# Patient Record
Sex: Male | Born: 1935 | Race: White | Hispanic: No | Marital: Married | Smoking: Never smoker
Health system: Southern US, Community
[De-identification: ages and names within clinical notes are randomized; demographics above are authoritative.]

## PROBLEM LIST (undated history)

## (undated) DIAGNOSIS — T4145XA Adverse effect of unspecified anesthetic, initial encounter: Secondary | ICD-10-CM

## (undated) DIAGNOSIS — K219 Gastro-esophageal reflux disease without esophagitis: Secondary | ICD-10-CM

## (undated) DIAGNOSIS — F329 Major depressive disorder, single episode, unspecified: Secondary | ICD-10-CM

## (undated) DIAGNOSIS — F32A Depression, unspecified: Secondary | ICD-10-CM

## (undated) DIAGNOSIS — Z9889 Other specified postprocedural states: Secondary | ICD-10-CM

## (undated) DIAGNOSIS — N189 Chronic kidney disease, unspecified: Secondary | ICD-10-CM

## (undated) DIAGNOSIS — C801 Malignant (primary) neoplasm, unspecified: Secondary | ICD-10-CM

## (undated) DIAGNOSIS — I10 Essential (primary) hypertension: Secondary | ICD-10-CM

## (undated) DIAGNOSIS — R112 Nausea with vomiting, unspecified: Secondary | ICD-10-CM

## (undated) DIAGNOSIS — I2510 Atherosclerotic heart disease of native coronary artery without angina pectoris: Secondary | ICD-10-CM

## (undated) DIAGNOSIS — M1990 Unspecified osteoarthritis, unspecified site: Secondary | ICD-10-CM

## (undated) DIAGNOSIS — Z87442 Personal history of urinary calculi: Secondary | ICD-10-CM

## (undated) DIAGNOSIS — T8859XA Other complications of anesthesia, initial encounter: Secondary | ICD-10-CM

## (undated) DIAGNOSIS — Z8719 Personal history of other diseases of the digestive system: Secondary | ICD-10-CM

## (undated) DIAGNOSIS — E7800 Pure hypercholesterolemia, unspecified: Secondary | ICD-10-CM

## (undated) HISTORY — PX: CARDIAC CATHETERIZATION: SHX172

## (undated) HISTORY — PX: EYE SURGERY: SHX253

## (undated) HISTORY — PX: LITHOTRIPSY: SUR834

## (undated) HISTORY — PX: TONSILLECTOMY: SUR1361

---

## 1988-04-25 DIAGNOSIS — C801 Malignant (primary) neoplasm, unspecified: Secondary | ICD-10-CM

## 1988-04-25 HISTORY — PX: PROSTATECTOMY: SHX69

## 1988-04-25 HISTORY — DX: Malignant (primary) neoplasm, unspecified: C80.1

## 2005-04-25 HISTORY — PX: CORONARY ANGIOPLASTY: SHX604

## 2007-04-26 HISTORY — PX: KNEE ARTHROPLASTY: SHX992

## 2012-04-25 HISTORY — PX: OTHER SURGICAL HISTORY: SHX169

## 2012-08-15 ENCOUNTER — Other Ambulatory Visit: Payer: Self-pay | Admitting: Urology

## 2012-08-15 DIAGNOSIS — N2889 Other specified disorders of kidney and ureter: Secondary | ICD-10-CM

## 2012-08-22 ENCOUNTER — Ambulatory Visit
Admission: RE | Admit: 2012-08-22 | Discharge: 2012-08-22 | Disposition: A | Discharge status: Home / Self Care | Payer: Medicare Other | Source: Ambulatory Visit | Attending: Urology | Admitting: Urology

## 2012-08-22 VITALS — BP 147/82 | HR 57 | Temp 98.0°F | Resp 16 | Ht 70.0 in | Wt 197.0 lb

## 2012-08-22 DIAGNOSIS — N2889 Other specified disorders of kidney and ureter: Secondary | ICD-10-CM

## 2012-08-22 HISTORY — DX: Chronic kidney disease, unspecified: N18.9

## 2012-08-22 HISTORY — DX: Major depressive disorder, single episode, unspecified: F32.9

## 2012-08-22 HISTORY — DX: Depression, unspecified: F32.A

## 2012-08-22 HISTORY — DX: Essential (primary) hypertension: I10

## 2012-08-22 HISTORY — DX: Gastro-esophageal reflux disease without esophagitis: K21.9

## 2012-08-22 HISTORY — DX: Malignant (primary) neoplasm, unspecified: C80.1

## 2012-08-22 HISTORY — DX: Atherosclerotic heart disease of native coronary artery without angina pectoris: I25.10

## 2012-08-31 ENCOUNTER — Telehealth: Payer: Self-pay | Admitting: Emergency Medicine

## 2012-08-31 NOTE — Telephone Encounter (Signed)
CALLED PT TO MAKE HIM AWARE THAT NO AUTHO NEEDED FOR HIS RENAL CRYO.  TOLD HIM TO EXPECT A CALL FROM TINA AT [facility] TO SET UP DATE/TIME.

## 2012-09-06 ENCOUNTER — Telehealth: Payer: Self-pay | Admitting: Interventional Radiology

## 2012-09-06 ENCOUNTER — Other Ambulatory Visit: Payer: Self-pay | Admitting: Interventional Radiology

## 2012-09-06 DIAGNOSIS — C641 Malignant neoplasm of right kidney, except renal pelvis: Secondary | ICD-10-CM

## 2012-09-06 NOTE — Progress Notes (Signed)
Called pt. 5/14.  Plan for Cryoablation at [facility]. To be scheduled next few weeks

## 2012-09-10 ENCOUNTER — Other Ambulatory Visit: Payer: Self-pay | Admitting: Radiology

## 2012-09-24 ENCOUNTER — Encounter (HOSPITAL_COMMUNITY): Payer: Self-pay | Admitting: Pharmacy Technician

## 2012-09-28 ENCOUNTER — Encounter (HOSPITAL_COMMUNITY)
Admission: RE | Admit: 2012-09-28 | Discharge: 2012-09-28 | Disposition: A | Discharge status: Home / Self Care | Payer: Medicare Other | Source: Ambulatory Visit | Attending: Emergency Medicine | Admitting: Emergency Medicine

## 2012-09-28 ENCOUNTER — Encounter (HOSPITAL_COMMUNITY): Payer: Self-pay

## 2012-09-28 DIAGNOSIS — Z0183 Encounter for blood typing: Secondary | ICD-10-CM | POA: Insufficient documentation

## 2012-09-28 DIAGNOSIS — Z01812 Encounter for preprocedural laboratory examination: Secondary | ICD-10-CM | POA: Insufficient documentation

## 2012-09-28 HISTORY — DX: Personal history of urinary calculi: Z87.442

## 2012-09-28 HISTORY — DX: Personal history of other diseases of the digestive system: Z87.19

## 2012-09-28 HISTORY — DX: Pure hypercholesterolemia, unspecified: E78.00

## 2012-09-28 HISTORY — DX: Adverse effect of unspecified anesthetic, initial encounter: T41.45XA

## 2012-09-28 HISTORY — DX: Unspecified osteoarthritis, unspecified site: M19.90

## 2012-09-28 HISTORY — DX: Other specified postprocedural states: Z98.890

## 2012-09-28 HISTORY — DX: Other complications of anesthesia, initial encounter: T88.59XA

## 2012-09-28 HISTORY — DX: Nausea with vomiting, unspecified: R11.2

## 2012-09-28 LAB — CBC
MCH: 30.7 pg (ref 26.0–34.0)
MCV: 88 fL (ref 78.0–100.0)
Platelets: 146 10*3/uL — ABNORMAL LOW (ref 150–400)
RBC: 4.76 MIL/uL (ref 4.22–5.81)
RDW: 13 % (ref 11.5–15.5)
WBC: 6.2 10*3/uL (ref 4.0–10.5)

## 2012-09-28 LAB — BASIC METABOLIC PANEL
CO2: 29 mEq/L (ref 19–32)
Calcium: 9.2 mg/dL (ref 8.4–10.5)
Creatinine, Ser: 1.1 mg/dL (ref 0.50–1.35)
GFR calc Af Amer: 73 mL/min — ABNORMAL LOW (ref >90)
Sodium: 143 mEq/L (ref 135–145)

## 2012-09-28 LAB — SURGICAL PCR SCREEN: MRSA, PCR: NEGATIVE

## 2012-09-28 LAB — PROTIME-INR
INR: 0.98 (ref 0.00–1.49)
Prothrombin Time: 12.9 seconds (ref 11.6–15.2)

## 2012-09-28 NOTE — Progress Notes (Signed)
LOV note 07/25/11 Dr. Bary Castilla on chart, EKG 06/21/12 on chart, heart cath note 06/26/12 on chart, stress test 06/23/08 on chart, Chest x-ray 06/25/12 on chart

## 2012-09-28 NOTE — Progress Notes (Signed)
09/28/12 1005  OBSTRUCTIVE SLEEP APNEA  Have you ever been diagnosed with sleep apnea through a sleep study? No  Do you snore loudly (loud enough to be heard through closed doors)?  1  Do you often feel tired, fatigued, or sleepy during the daytime? 0  Has anyone observed you stop breathing during your sleep? 0  Do you have, or are you being treated for high blood pressure? 1  BMI more than 35 kg/m2? 0  Age over 77 years old? 1  Neck circumference greater than 40 cm/18 inches? 0  Gender: 1  Obstructive Sleep Apnea Score 4

## 2012-09-28 NOTE — Progress Notes (Deleted)
09/28/12 1043  OBSTRUCTIVE SLEEP APNEA  Score 4 or greater  Results sent to PCP

## 2012-09-28 NOTE — Patient Instructions (Addendum)
20 Dylan Harris  09/28/2012   Your procedure is scheduled on: 10/05/12  Report to [facility] at 0600 AM.  Call this number if you have problems the morning of surgery [phone]   Remember:   Do not eat food or drink liquids After Midnight.     Take these medicines the morning of surgery with A SIP OF WATER: amlodipine, bystolic, zoloft    Do not wear jewelry, make-up or nail polish.  Do not wear lotions, powders, or perfumes. You may wear deodorant.  Do not shave 48 hours prior to surgery. Men may shave face and neck.  Do not bring valuables to the hospital.  Contacts, dentures or bridgework may not be worn into surgery.  Leave suitcase in the car. After surgery it may be brought to your room.  For patients admitted to the hospital, checkout time is 11:00 AM the day of discharge.   Please read over the following fact sheets that you were given: MRSA Information, blood fact sheet, incentive spirometry fact sheet  Birdie Sons, RN  pre op nurse call if needed [phone]    FAILURE TO FOLLOW THESE INSTRUCTIONS MAY RESULT IN CANCELLATION OF YOUR SURGERY   Patient Signature: ___________________________________________

## 2012-10-03 ENCOUNTER — Other Ambulatory Visit: Payer: Self-pay | Admitting: Radiology

## 2012-10-04 ENCOUNTER — Other Ambulatory Visit: Payer: Self-pay | Admitting: Radiology

## 2012-10-05 ENCOUNTER — Ambulatory Visit (HOSPITAL_COMMUNITY): Payer: Medicare Other | Admitting: Anesthesiology

## 2012-10-05 ENCOUNTER — Encounter (HOSPITAL_COMMUNITY): Payer: Self-pay | Admitting: General Practice

## 2012-10-05 ENCOUNTER — Observation Stay (HOSPITAL_COMMUNITY)
Admission: RE | Admit: 2012-10-05 | Discharge: 2012-10-06 | Disposition: A | Discharge status: Home / Self Care | Payer: Medicare Other | Source: Ambulatory Visit | Attending: Interventional Radiology | Admitting: Interventional Radiology

## 2012-10-05 ENCOUNTER — Encounter (HOSPITAL_COMMUNITY): Payer: Self-pay | Admitting: Anesthesiology

## 2012-10-05 ENCOUNTER — Ambulatory Visit (HOSPITAL_COMMUNITY)
Admission: RE | Admit: 2012-10-05 | Discharge: 2012-10-05 | Disposition: A | Discharge status: Home / Self Care | Payer: Medicare Other | Source: Ambulatory Visit | Attending: Interventional Radiology | Admitting: Interventional Radiology

## 2012-10-05 ENCOUNTER — Encounter (HOSPITAL_COMMUNITY)
Admission: RE | Disposition: A | Discharge status: Home / Self Care | Payer: Self-pay | Source: Ambulatory Visit | Attending: Interventional Radiology

## 2012-10-05 DIAGNOSIS — E876 Hypokalemia: Secondary | ICD-10-CM | POA: Insufficient documentation

## 2012-10-05 DIAGNOSIS — C641 Malignant neoplasm of right kidney, except renal pelvis: Secondary | ICD-10-CM

## 2012-10-05 DIAGNOSIS — I10 Essential (primary) hypertension: Secondary | ICD-10-CM | POA: Insufficient documentation

## 2012-10-05 DIAGNOSIS — K219 Gastro-esophageal reflux disease without esophagitis: Secondary | ICD-10-CM | POA: Insufficient documentation

## 2012-10-05 DIAGNOSIS — Z79899 Other long term (current) drug therapy: Secondary | ICD-10-CM | POA: Insufficient documentation

## 2012-10-05 DIAGNOSIS — C649 Malignant neoplasm of unspecified kidney, except renal pelvis: Principal | ICD-10-CM | POA: Insufficient documentation

## 2012-10-05 DIAGNOSIS — Z8546 Personal history of malignant neoplasm of prostate: Secondary | ICD-10-CM | POA: Insufficient documentation

## 2012-10-05 DIAGNOSIS — I2510 Atherosclerotic heart disease of native coronary artery without angina pectoris: Secondary | ICD-10-CM | POA: Insufficient documentation

## 2012-10-05 DIAGNOSIS — N2889 Other specified disorders of kidney and ureter: Secondary | ICD-10-CM | POA: Diagnosis present

## 2012-10-05 DIAGNOSIS — E7800 Pure hypercholesterolemia, unspecified: Secondary | ICD-10-CM | POA: Insufficient documentation

## 2012-10-05 LAB — BASIC METABOLIC PANEL
BUN: 18 mg/dL (ref 6–23)
CO2: 26 mEq/L (ref 19–32)
Chloride: 107 mEq/L (ref 96–112)
Glucose, Bld: 106 mg/dL — ABNORMAL HIGH (ref 70–99)
Potassium: 3 mEq/L — ABNORMAL LOW (ref 3.5–5.1)
Sodium: 141 mEq/L (ref 135–145)

## 2012-10-05 LAB — CBC
HCT: 40.1 % (ref 39.0–52.0)
Hemoglobin: 14.1 g/dL (ref 13.0–17.0)
MCH: 30.7 pg (ref 26.0–34.0)
MCHC: 35.2 g/dL (ref 30.0–36.0)
MCV: 87.2 fL (ref 78.0–100.0)
RBC: 4.6 MIL/uL (ref 4.22–5.81)

## 2012-10-05 LAB — TYPE AND SCREEN: Antibody Screen: NEGATIVE

## 2012-10-05 SURGERY — RADIO FREQUENCY ABLATION
Anesthesia: General | Laterality: Right | Wound class: Clean

## 2012-10-05 MED ORDER — HYDROCODONE-ACETAMINOPHEN 5-325 MG PO TABS
1.0000 | ORAL_TABLET | ORAL | Status: DC | PRN
  Administered 2012-10-05: 1 via ORAL
  Filled 2012-10-05: qty 1

## 2012-10-05 MED ORDER — FENTANYL CITRATE 0.05 MG/ML IJ SOLN
INTRAMUSCULAR | Status: DC | PRN
  Administered 2012-10-05: 50 ug via INTRAVENOUS

## 2012-10-05 MED ORDER — ONDANSETRON HCL 4 MG/2ML IJ SOLN: 4.0000 mg | Freq: Four times a day (QID) | INTRAMUSCULAR | Status: DC | PRN

## 2012-10-05 MED ORDER — OXYCODONE HCL 5 MG PO TABS: 5.0000 mg | ORAL_TABLET | Freq: Once | ORAL | Status: DC | PRN

## 2012-10-05 MED ORDER — OXYCODONE HCL 5 MG/5ML PO SOLN
5.0000 mg | Freq: Once | ORAL | Status: DC | PRN
  Filled 2012-10-05: qty 5

## 2012-10-05 MED ORDER — LACTATED RINGERS IV SOLN
INTRAVENOUS | Status: DC | PRN
  Administered 2012-10-05 (×2): via INTRAVENOUS

## 2012-10-05 MED ORDER — MEPERIDINE HCL 50 MG/ML IJ SOLN: 6.2500 mg | INTRAMUSCULAR | Status: DC | PRN

## 2012-10-05 MED ORDER — ONDANSETRON HCL 4 MG/2ML IJ SOLN
INTRAMUSCULAR | Status: DC | PRN
  Administered 2012-10-05: 4 mg via INTRAVENOUS

## 2012-10-05 MED ORDER — ACETAMINOPHEN 10 MG/ML IV SOLN
1000.0000 mg | Freq: Once | INTRAVENOUS | Status: AC | PRN
  Administered 2012-10-05: 1000 mg via INTRAVENOUS

## 2012-10-05 MED ORDER — CEFAZOLIN SODIUM-DEXTROSE 2-3 GM-% IV SOLR
2.0000 g | INTRAVENOUS | Status: AC
  Administered 2012-10-05: 2 g via INTRAVENOUS
  Filled 2012-10-05 (×2): qty 50

## 2012-10-05 MED ORDER — HYDROMORPHONE HCL PF 1 MG/ML IJ SOLN
0.2500 mg | INTRAMUSCULAR | Status: DC | PRN
  Administered 2012-10-05: 0.25 mg via INTRAVENOUS

## 2012-10-05 MED ORDER — LACTATED RINGERS IV SOLN
INTRAVENOUS | Status: DC
  Administered 2012-10-05: 20 mL/h via INTRAVENOUS

## 2012-10-05 MED ORDER — GLYCOPYRROLATE 0.2 MG/ML IJ SOLN
INTRAMUSCULAR | Status: DC | PRN
  Administered 2012-10-05: .6 mg via INTRAVENOUS

## 2012-10-05 MED ORDER — LIDOCAINE HCL (CARDIAC) 20 MG/ML IV SOLN
INTRAVENOUS | Status: DC | PRN
  Administered 2012-10-05: 100 mg via INTRAVENOUS

## 2012-10-05 MED ORDER — EPHEDRINE SULFATE 50 MG/ML IJ SOLN
INTRAMUSCULAR | Status: DC | PRN
  Administered 2012-10-05: 10 mg via INTRAVENOUS

## 2012-10-05 MED ORDER — NEOSTIGMINE METHYLSULFATE 1 MG/ML IJ SOLN
INTRAMUSCULAR | Status: DC | PRN
  Administered 2012-10-05: 4 mg via INTRAVENOUS

## 2012-10-05 MED ORDER — DOCUSATE SODIUM 100 MG PO CAPS
100.0000 mg | ORAL_CAPSULE | Freq: Two times a day (BID) | ORAL | Status: DC
  Administered 2012-10-06: 100 mg via ORAL
  Filled 2012-10-05 (×4): qty 1

## 2012-10-05 MED ORDER — DEXAMETHASONE SODIUM PHOSPHATE 10 MG/ML IJ SOLN
INTRAMUSCULAR | Status: DC | PRN
  Administered 2012-10-05: 10 mg via INTRAVENOUS

## 2012-10-05 MED ORDER — MIDAZOLAM HCL 5 MG/5ML IJ SOLN
INTRAMUSCULAR | Status: DC | PRN
  Administered 2012-10-05: 1 mg via INTRAVENOUS

## 2012-10-05 MED ORDER — ACETAMINOPHEN 10 MG/ML IV SOLN
INTRAVENOUS | Status: AC
  Filled 2012-10-05: qty 100

## 2012-10-05 MED ORDER — PROMETHAZINE HCL 25 MG/ML IJ SOLN: 6.2500 mg | INTRAMUSCULAR | Status: DC | PRN

## 2012-10-05 MED ORDER — HYDROMORPHONE HCL PF 1 MG/ML IJ SOLN
INTRAMUSCULAR | Status: AC
  Filled 2012-10-05: qty 1

## 2012-10-05 MED ORDER — CISATRACURIUM BESYLATE (PF) 10 MG/5ML IV SOLN
INTRAVENOUS | Status: DC | PRN
  Administered 2012-10-05: 12 mg via INTRAVENOUS
  Administered 2012-10-05: 2 mg via INTRAVENOUS

## 2012-10-05 MED ORDER — PROPOFOL 10 MG/ML IV BOLUS
INTRAVENOUS | Status: DC | PRN
  Administered 2012-10-05: 80 mg via INTRAVENOUS
  Administered 2012-10-05: 120 mg via INTRAVENOUS

## 2012-10-05 NOTE — Procedures (Signed)
Successful 1.9cm RT RCC CRYOABLATION NO COMP STABLE OBS OVERNIGHT FULL REPORT IN PACS

## 2012-10-05 NOTE — Transfer of Care (Signed)
Immediate Anesthesia Transfer of Care Note  Patient: Dylan Harris  Procedure(s) Performed: Procedure(s): RIGHT RENAL CYRO  ABLATION (Right)  Patient Location: PACU  Anesthesia Type:General  Level of Consciousness: awake, alert  and sedated  Airway & Oxygen Therapy: Patient Spontanous Breathing and Patient connected to face mask oxygen  Post-op Assessment: Report given to PACU RN and Post -op Vital signs reviewed and stable  Post vital signs: Reviewed and stable  Complications: No apparent anesthesia complications

## 2012-10-05 NOTE — H&P (Signed)
Dylan Harris is an 79 y.o. male.   Chief Complaint: right renal mass HPI: Patient with history of prostate carcinoma and recent imaging revealing bilateral renal lesions presents today for elective CT guided cryoablation of a 1.9 cm solid right lower pole renal mass.  Past Medical History  Diagnosis Date  . Coronary artery disease     2 stents  . Hypertension   . GERD (gastroesophageal reflux disease)   . Cancer 1990    prostate  . Chronic kidney disease     many times  . Depression     mild, on Zoloft  . H/O hiatal hernia   . Hypercholesteremia   . History of kidney stones   . Arthritis   . Complication of anesthesia     general anesthesia- severe vomiting, woke up during surgery vomiting and for 13 hours after  . PONV (postoperative nausea and vomiting)     Past Surgical History  Procedure Laterality Date  . Prostatectomy  1990  . Knee arthroplasty  2009    medial half of right knee replaced  . Cardiac catheterization    . Coronary angioplasty  2007    2 stents placed  . Esophagus stretch  2014  . Tonsillectomy    . Lithotripsy    . Eye surgery Bilateral     cataract    History reviewed. No pertinent family history. Social History:  reports that he has never smoked. He has never used smokeless tobacco. He reports that he does not drink alcohol or use illicit drugs.  Allergies: No Known Allergies  Medications Prior to Admission  Medication Sig Dispense Refill  . amLODipine (NORVASC) 10 MG tablet Take 10 mg by mouth every morning.       . hydrochlorothiazide (HYDRODIURIL) 25 MG tablet Take 25 mg by mouth every morning.       Marland Kitchen losartan (COZAAR) 100 MG tablet Take 100 mg by mouth every morning.       . Nebivolol HCl (BYSTOLIC) 20 MG TABS Take 20 mg by mouth every morning.      . niacin (NIASPAN) 500 MG CR tablet Take 1,000 mg by mouth every evening.       . potassium chloride (K-DUR) 10 MEQ tablet Take 10 mEq by mouth 2 (two) times daily.      . sertraline  (ZOLOFT) 100 MG tablet Take 100 mg by mouth every morning.       . simvastatin (ZOCOR) 40 MG tablet Take 40 mg by mouth every evening.      . zolpidem (AMBIEN) 10 MG tablet Take 10 mg by mouth at bedtime as needed for sleep.      Marland Kitchen aspirin 325 MG tablet Take 325 mg by mouth every evening.       . Multiple Vitamin (MULTIVITAMIN) tablet Take 1 tablet by mouth daily.      . Multiple Vitamins-Minerals (ICAPS) CAPS Take 1 capsule by mouth 2 (two) times daily.        Results for orders placed during the hospital encounter of 10/05/12 (from the past 48 hour(s))  BASIC METABOLIC PANEL     Status: Abnormal   Collection Time    10/05/12  6:50 AM      Result Value Range   Sodium 141  135 - 145 mEq/L   Potassium 3.0 (*) 3.5 - 5.1 mEq/L   Chloride 107  96 - 112 mEq/L   CO2 26  19 - 32 mEq/L   Glucose, Bld 106 (*)  70 - 99 mg/dL   BUN 18  6 - 23 mg/dL   Creatinine, Ser 2.72  0.50 - 1.35 mg/dL   Calcium 9.0  8.4 - 53.6 mg/dL   GFR calc non Af Amer 63 (*) >90 mL/min   GFR calc Af Amer 74 (*) >90 mL/min   Comment:            The eGFR has been calculated     using the CKD EPI equation.     This calculation has not been     validated in all clinical     situations.     eGFR's persistently     <90 mL/min signify     possible Chronic Kidney Disease.  CBC     Status: Abnormal   Collection Time    10/05/12  6:50 AM      Result Value Range   WBC 5.7  4.0 - 10.5 K/uL   RBC 4.60  4.22 - 5.81 MIL/uL   Hemoglobin 14.1  13.0 - 17.0 g/dL   HCT 64.4  03.4 - 74.2 %   MCV 87.2  78.0 - 100.0 fL   MCH 30.7  26.0 - 34.0 pg   MCHC 35.2  30.0 - 36.0 g/dL   RDW 59.5  63.8 - 75.6 %   Platelets 134 (*) 150 - 400 K/uL   No results found.  Review of Systems  Constitutional: Negative for fever and chills.  Respiratory: Negative for cough and shortness of breath.   Cardiovascular: Negative for chest pain.  Gastrointestinal: Negative for nausea, vomiting and abdominal pain.  Musculoskeletal: Negative for back  pain.  Neurological: Negative for headaches.  Endo/Heme/Allergies: Does not bruise/bleed easily.    Blood pressure 147/73, pulse 57, temperature 97.7 F (36.5 C), temperature source Oral, resp. rate 18, SpO2 99.00%. Physical Exam  Constitutional: He is oriented to person, place, and time. He appears well-developed and well-nourished.  Cardiovascular: Normal rate and regular rhythm.   Respiratory: Effort normal and breath sounds normal.  GI: Soft. Bowel sounds are normal. There is no tenderness.  Musculoskeletal: Normal range of motion. He exhibits no edema.  Neurological: He is alert and oriented to person, place, and time.     Assessment/Plan Pt with history of prostate carcinoma and bilateral renal lesions. Plan is for CT guided cryoablation of a 1.9 cm  right lower pole renal mass today. Details/risks of procedure d/w pt/family with their understanding and consent. Following the procedure, the pt will be admitted for overnight observation. Replace K- currently 3.0.  ALLRED,D KEVIN 10/05/2012, 7:59 AM

## 2012-10-05 NOTE — Preoperative (Signed)
Beta Blockers   Reason not to administer Beta Blockers:Not Applicable 

## 2012-10-05 NOTE — Anesthesia Postprocedure Evaluation (Signed)
Anesthesia Post Note  Patient: Dylan Harris  Procedure(s) Performed: Procedure(s) (LRB): RIGHT RENAL CYRO  ABLATION (Right)  Anesthesia type: General  Patient location: PACU  Post pain: Pain level controlled  Post assessment: Post-op Vital signs reviewed  Last Vitals: BP 132/57  Pulse 51  Temp(Src) 36.3 C (Oral)  Resp 12  SpO2 97%  Post vital signs: Reviewed  Level of consciousness: sedated  Complications: No apparent anesthesia complications

## 2012-10-05 NOTE — Anesthesia Preprocedure Evaluation (Addendum)
Anesthesia Evaluation  Patient identified by MRN, date of birth, ID band Patient awake    Reviewed: Allergy & Precautions, H&P , NPO status , Patient's Chart, lab work & pertinent test results  History of Anesthesia Complications (+) PONV  Airway       Dental  (+) Dental Advisory Given   Pulmonary neg pulmonary ROS,          Cardiovascular hypertension, Pt. on medications + CAD negative cardio ROS      Neuro/Psych PSYCHIATRIC DISORDERS Depression negative neurological ROS     GI/Hepatic Neg liver ROS, hiatal hernia, GERD-  Medicated,  Endo/Other  negative endocrine ROS  Renal/GU Renal disease     Musculoskeletal negative musculoskeletal ROS (+)   Abdominal   Peds  Hematology negative hematology ROS (+)   Anesthesia Other Findings   Reproductive/Obstetrics negative OB ROS                          Anesthesia Physical Anesthesia Plan  ASA: III  Anesthesia Plan: General   Post-op Pain Management:    Induction: Intravenous  Airway Management Planned: Oral ETT  Additional Equipment:   Intra-op Plan:   Post-operative Plan: Extubation in OR  Informed Consent: I have reviewed the patients History and Physical, chart, labs and discussed the procedure including the risks, benefits and alternatives for the proposed anesthesia with the patient or authorized representative who has indicated his/her understanding and acceptance.   Dental advisory given  Plan Discussed with: CRNA  Anesthesia Plan Comments:         Anesthesia Quick Evaluation

## 2012-10-05 NOTE — Progress Notes (Addendum)
Day of Surgery  Subjective: Pt doing fairly well; reports only minimal rt flank discomfort; tolerating diet well; denies N/V; no hematuria  Objective: Vital signs in last 24 hours: Temp:  [96.8 F (36 C)-97.7 F (36.5 C)] 97.5 F (36.4 C) (06/13 1407) Pulse Rate:  [50-63] 63 (06/13 1407) Resp:  [12-20] 20 (06/13 1407) BP: (116-153)/(55-73) 124/63 mmHg (06/13 1407) SpO2:  [94 %-99 %] 97 % (06/13 1407) Last BM Date: 10/05/12  Intake/Output from previous day:   Intake/Output this shift: Total I/O In: 1500 [I.V.:1500] Out: 400 [Urine:400]   pt awake/alert; chest- CTA bilat; heart- RRR; abd-soft,+BS,NT; puncture site rt flank covered with clean, intact gauze dressing, mildly tender; ext- FROM, no edema; Foley cath in place with clear, yellow urine  Lab Results:   Recent Labs  10/05/12 0650  WBC 5.7  HGB 14.1  HCT 40.1  PLT 134*   BMET  Recent Labs  10/05/12 0650  NA 141  K 3.0*  CL 107  CO2 26  GLUCOSE 106*  BUN 18  CREATININE 1.09  CALCIUM 9.0   PT/INR No results found for this basename: LABPROT, INR,  in the last 72 hours ABG No results found for this basename: PHART, PCO2, PO2, HCO3,  in the last 72 hours  Studies/Results: Ct Guide Tissue Ablation  10/05/2012   *RADIOLOGY REPORT*  Clinical Data: 1.9 cm right renal cell carcinoma  CT GUIDED RIGHT RENAL CELL CARCINOMA CRYOABLATION  Date:  10/05/2012 08:00:00  Radiologist:  M. Ruel Favors, M.D.  Medications:  General anesthesia, 2 grams ancef administered within 1 hour of the procedure  Guidance:  CT  Complications:  No immediate  PROCEDURE/FINDINGS:  Informed consent was obtained from the patient following explanation of the procedure, risks, benefits and alternatives. The patient understands, agrees and consents for the procedure. All questions were addressed.  A time out was performed.  Maximal barrier sterile technique utilized including caps, mask, sterile gowns, sterile gloves, large sterile drape, hand  hygiene, and betadine  Previous imaging reviewed.  After induction of general anesthesia, the patient was repositioned prone.  Noncontrast localization CT performed.  The 1.9 cm exophytic posterior right renal mass was localized.  Under sterile conditions, 2  17 gauge ice rod plus cryoablation needle probes were percutaneously advanced from a posterior oblique approach into the lesion.  Needles were placed within the lesion in a parallel fashion for adequate coverage. Needle positions reconfirmed with noncontrast CT.  Cryoablation successfully performed of the 1.9 cm right renal mass initially with an 8-minute freeze followed by a thaw phase and a second 6 minutes cryoablation.  Final thaw phase was performed.  Needles were removed.  Post procedure imaging demonstrates an adequate "ice ball" coverage of the underlying lesion.  No perinephric hemorrhage or hematoma.  The patient tolerated the procedure well.  The patient was extubated and stable to the recovery room.  IMPRESSION: Successful CT guided right renal cell carcinoma percutaneous cryoablation.   Original Report Authenticated By: Judie Petit. Miles Costain, M.D.    Anti-infectives: Anti-infectives   Start     Dose/Rate Route Frequency Ordered Stop   10/05/12 0630  ceFAZolin (ANCEF) IVPB 2 g/50 mL premix     2 g 100 mL/hr over 30 Minutes Intravenous On call 10/05/12 0626 10/05/12 0800      Assessment/Plan: S/p CT guided right renal mass cryoablation 6/13; d/c Foley at 1700; for overnight obs, check am labs, f/u with Dr. Miles Costain in IR clinic in 4 weeks   LOS: 0 days  ALLRED,D [facility] 10/05/2012

## 2012-10-06 ENCOUNTER — Other Ambulatory Visit: Payer: Self-pay | Admitting: Radiology

## 2012-10-06 DIAGNOSIS — N2889 Other specified disorders of kidney and ureter: Secondary | ICD-10-CM | POA: Diagnosis present

## 2012-10-06 LAB — CBC
MCH: 29.4 pg (ref 26.0–34.0)
MCHC: 33.5 g/dL (ref 30.0–36.0)
Platelets: 125 10*3/uL — ABNORMAL LOW (ref 150–400)
RDW: 13.2 % (ref 11.5–15.5)

## 2012-10-06 LAB — BASIC METABOLIC PANEL
BUN: 22 mg/dL (ref 6–23)
Calcium: 8.6 mg/dL (ref 8.4–10.5)
GFR calc non Af Amer: 53 mL/min — ABNORMAL LOW (ref >90)
Glucose, Bld: 138 mg/dL — ABNORMAL HIGH (ref 70–99)

## 2012-10-06 MED ORDER — POTASSIUM CHLORIDE CRYS ER 20 MEQ PO TBCR
60.0000 meq | EXTENDED_RELEASE_TABLET | Freq: Once | ORAL | Status: AC
  Administered 2012-10-06: 60 meq via ORAL
  Filled 2012-10-06 (×2): qty 3

## 2012-10-06 NOTE — Progress Notes (Signed)
Patient discharged to home with family, discharge instructions reviewed with patient and family who verbalized understanding. No new RX's given.

## 2012-10-06 NOTE — Discharge Summary (Signed)
Physician Discharge Summary  Patient ID: Dylan Harris MRN: [id number] DOB/AGE: November 16, 1933 79 y.o.  Admit date: 10/05/2012 Discharge date: 10/06/2012  Admission Diagnoses: Principal Problem:   Renal mass, right  Discharge Diagnoses:  Principal Problem:   Renal mass, right  Hypokalemia-corrected  Procedures: Procedure(s): RIGHT RENAL CYRO  ABLATION  Discharged Condition: good  Hospital Course: HPI: Patient with history of prostate carcinoma and recent imaging revealing bilateral renal lesions presents today for elective CT guided cryoablation of a 1.9 cm solid right lower pole renal mass  Pt brought to CT suite on 6/13 and after general anesthesia, underwent successful right renal cryoablation of renal mass. Tolerated procedure well and admitted to floor in stable condition. No events overnight. Foley was removed, voiding well on own, no hematuria. Has eaten regular diet, no N/V. Labs reviewed, serum K+ is low, and was replaced orally before discharge. Feel pt is stable for discharge. Instructions, limitations, and medications reviewed. Follow up plans in 4 weeks.   Consults: None   Discharge Exam: Blood pressure 121/56, pulse 58, temperature 98.3 F (36.8 C), temperature source Oral, resp. rate 18, height 5\' 11"  (1.803 m), SpO2 95.00%. General: NAD, nontoxic Lungs: CTA without w/r/r Heart: Regular Abdomen: soft, ND, NT Back/Flank: puncture sites clean, dry, no hematoma, NT  CBC    Component Value Date/Time   WBC 12.9* 10/06/2012 0505   RBC 4.18* 10/06/2012 0505   HGB 12.3* 10/06/2012 0505   HCT 36.7* 10/06/2012 0505   PLT 125* 10/06/2012 0505   MCV 87.8 10/06/2012 0505   MCH 29.4 10/06/2012 0505   MCHC 33.5 10/06/2012 0505   RDW 13.2 10/06/2012 0505    BMET    Component Value Date/Time   NA 141 10/06/2012 0505   K 2.8* 10/06/2012 0505   CL 107 10/06/2012 0505   CO2 27 10/06/2012 0505   GLUCOSE 138* 10/06/2012 0505   BUN 22 10/06/2012 0505   CREATININE 1.26 10/06/2012  0505   CALCIUM 8.6 10/06/2012 0505   GFRNONAA 53* 10/06/2012 0505   GFRAA 62* 10/06/2012 0505    Disposition: Home     Medication List    ASK your doctor about these medications       amLODipine 10 MG tablet  Commonly known as:  NORVASC  Take 10 mg by mouth every morning.     aspirin 325 MG tablet  Take 325 mg by mouth every evening.     BYSTOLIC 20 MG Tabs  Generic drug:  Nebivolol HCl  Take 20 mg by mouth every morning.     hydrochlorothiazide 25 MG tablet  Commonly known as:  HYDRODIURIL  Take 25 mg by mouth every morning.     ICAPS Caps  Take 1 capsule by mouth 2 (two) times daily.     losartan 100 MG tablet  Commonly known as:  COZAAR  Take 100 mg by mouth every morning.     multivitamin tablet  Take 1 tablet by mouth daily.     niacin 500 MG CR tablet  Commonly known as:  NIASPAN  Take 1,000 mg by mouth every evening.     potassium chloride 10 MEQ tablet  Commonly known as:  K-DUR  Take 10 mEq by mouth 2 (two) times daily.     sertraline 100 MG tablet  Commonly known as:  ZOLOFT  Take 100 mg by mouth every morning.     simvastatin 40 MG tablet  Commonly known as:  ZOCOR  Take 40 mg by mouth every evening.  zolpidem 10 MG tablet  Commonly known as:  AMBIEN  Take 10 mg by mouth at bedtime as needed for sleep.         SignedBrayton El PA-C 10/06/2012, 8:08 AM

## 2012-10-09 ENCOUNTER — Other Ambulatory Visit (HOSPITAL_COMMUNITY): Payer: Self-pay | Admitting: Interventional Radiology

## 2012-10-09 ENCOUNTER — Other Ambulatory Visit: Payer: Self-pay | Admitting: Emergency Medicine

## 2012-10-09 DIAGNOSIS — C642 Malignant neoplasm of left kidney, except renal pelvis: Secondary | ICD-10-CM

## 2012-10-09 DIAGNOSIS — N2889 Other specified disorders of kidney and ureter: Secondary | ICD-10-CM

## 2012-10-09 NOTE — Progress Notes (Unsigned)
Patient ID: Dylan Harris, male   DOB: [birth year], 79 y.o.   MRN: [id number] Home phone:  The patient reported abdominal and R flank swelling during a routine follow up phone call. I spoke with him as well. He denies any pain, fevers or hematuria. It has improved since yesterday. He was instructed to follow his symptoms for improvement and contact us if pain or fevers develop.

## 2012-10-18 ENCOUNTER — Telehealth: Payer: Self-pay | Admitting: Emergency Medicine

## 2012-10-18 ENCOUNTER — Other Ambulatory Visit (HOSPITAL_COMMUNITY): Payer: Self-pay | Admitting: Interventional Radiology

## 2012-10-18 DIAGNOSIS — R1900 Intra-abdominal and pelvic swelling, mass and lump, unspecified site: Secondary | ICD-10-CM

## 2012-10-18 DIAGNOSIS — R109 Unspecified abdominal pain: Secondary | ICD-10-CM

## 2012-10-18 DIAGNOSIS — N2889 Other specified disorders of kidney and ureter: Secondary | ICD-10-CM

## 2012-10-18 NOTE — Telephone Encounter (Signed)
PT CALLED W/ C/C OF RT SIDED ABD PAIN/SWELLING, TENDERNESS.  FROM NAVEL/BELTLINE REGION TO LATERAL REGION. NOT ANY BETTER SINCE HE S/W DR HOSS ON 10-09-12.  INCREASED ACTIVITY AGGRAVATES THE AREA.  HE DOESN'T LIFT ANYTHING. NO COMPLAINTS OF HEMATURIA, FEVER, CHILLS, SWEATS AND HE IS NOW 13D POST RENAL CRYOABLATION.  F/U APPT IS SCHEDULED HER  ON 11-13-12.  PAGED DR [facility] AT 1444PM- 1448- DR SHICK TO CALL PT DIRECTLY.

## 2012-10-23 ENCOUNTER — Ambulatory Visit (HOSPITAL_COMMUNITY)
Admission: RE | Admit: 2012-10-23 | Discharge: 2012-10-23 | Disposition: A | Discharge status: Home / Self Care | Payer: Medicare Other | Source: Ambulatory Visit | Attending: Interventional Radiology | Admitting: Interventional Radiology

## 2012-10-23 ENCOUNTER — Other Ambulatory Visit (HOSPITAL_COMMUNITY): Payer: Self-pay | Admitting: Interventional Radiology

## 2012-10-23 ENCOUNTER — Other Ambulatory Visit: Payer: Self-pay | Admitting: Emergency Medicine

## 2012-10-23 ENCOUNTER — Encounter (HOSPITAL_COMMUNITY): Payer: Self-pay

## 2012-10-23 ENCOUNTER — Ambulatory Visit
Admission: RE | Admit: 2012-10-23 | Discharge: 2012-10-23 | Disposition: A | Discharge status: Home / Self Care | Payer: Medicare Other | Source: Ambulatory Visit | Attending: Interventional Radiology | Admitting: Interventional Radiology

## 2012-10-23 DIAGNOSIS — N2889 Other specified disorders of kidney and ureter: Secondary | ICD-10-CM

## 2012-10-23 DIAGNOSIS — R1900 Intra-abdominal and pelvic swelling, mass and lump, unspecified site: Secondary | ICD-10-CM

## 2012-10-23 DIAGNOSIS — I700 Atherosclerosis of aorta: Secondary | ICD-10-CM | POA: Insufficient documentation

## 2012-10-23 DIAGNOSIS — K7689 Other specified diseases of liver: Secondary | ICD-10-CM | POA: Insufficient documentation

## 2012-10-23 DIAGNOSIS — R109 Unspecified abdominal pain: Secondary | ICD-10-CM

## 2012-10-23 DIAGNOSIS — N281 Cyst of kidney, acquired: Secondary | ICD-10-CM | POA: Insufficient documentation

## 2012-10-23 DIAGNOSIS — D1803 Hemangioma of intra-abdominal structures: Secondary | ICD-10-CM | POA: Insufficient documentation

## 2012-10-23 DIAGNOSIS — N200 Calculus of kidney: Secondary | ICD-10-CM | POA: Insufficient documentation

## 2012-10-23 DIAGNOSIS — N289 Disorder of kidney and ureter, unspecified: Secondary | ICD-10-CM | POA: Insufficient documentation

## 2012-10-23 LAB — CBC
MCH: 29.1 pg (ref 26.0–34.0)
MCV: 90.7 fL (ref 78.0–100.0)
Platelets: 160 10*3/uL (ref 150–400)
RDW: 13.1 % (ref 11.5–15.5)

## 2012-10-23 LAB — BASIC METABOLIC PANEL
Calcium: 10 mg/dL (ref 8.4–10.5)
Creat: 1 mg/dL (ref 0.50–1.35)

## 2012-10-23 MED ORDER — IOHEXOL 300 MG/ML  SOLN
100.0000 mL | Freq: Once | INTRAMUSCULAR | Status: AC | PRN
  Administered 2012-10-23: 100 mL via INTRAVENOUS

## 2012-10-23 NOTE — Progress Notes (Signed)
2 wk s/p:  Cryoablation of Right Renal Mass (10/05/2012)  Patient complaining of RLQ discomfort starting the afternoon of the procedure.  States that he has been taking Aleve 1-2-tabs as needed for pain (3-4 times/week).  Discomfort has not required use of Rx strength pain meds.  No improvement in discomfort.  Normal bowel habits.  Afebrile.  Denies hematuria or any other problems with urination.  Occasional nausea.  No vomiting.    Will obtain CBC & BMP & CT Abd with and without contrast per Dr Bonnielee Haff.  Jaymie Misch Carmell [place], [place] 10/23/2012 8:55 AM

## 2012-10-24 NOTE — Progress Notes (Signed)
Patient ID: Dylan Harris, male   DOB: Feb 08, 1934, 79 y.o.   MRN: [id number] I spoke with Adline Mango today on the phone regarding his symptoms, lab results and Ct scan results. His blood work is normal including the Hb and potassium. The CT shows a good ablation zone and no complication. His symptoms are likely related to partial colonic ileus. I will defer to Dr. Miles Costain regarding the additional hypodensities in the kidneys, described on the recent CT scan report.

## 2012-10-29 ENCOUNTER — Telehealth: Payer: Self-pay | Admitting: Emergency Medicine

## 2012-10-29 NOTE — Telephone Encounter (Signed)
Message copied by Jari Sportsman on Mon Oct 29, 2012  1:28 PM ------      Message from: Berdine Dance      Created: Mon Oct 29, 2012 12:46 PM      Regarding: RE: SEE PHONE NOTE       CT WAS NEG FOR ANY ACUTE PROBLEM.  F/U IN 3 MONTHS WITH A CT SCAN            THANKS      TS                  ----- Message -----         From: Jari Sportsman, EMT         Sent: 10/25/2012   9:36 AM           To: Berdine Dance, MD      Subject: SEE PHONE NOTE                                           PLEASE SEE F/U AND PHONE NOTES IN EPIC, CT RESULTS AND PLEASE ADVISE ON F/U APPT.                    Dylan Harris       ------

## 2012-10-29 NOTE — Telephone Encounter (Signed)
Called pt to make him aware that Dr Miles Costain reviewed the CT and note from last week and will need a return visit in 6mo w/ another CT scan. Pt has concerns and also wishes to find out what Dr Bonnielee Haff meant by having Bilat Renal Abnormalities.    Offered pt an appt to come in office on 10-31-12.  If Dr Miles Costain is able to discuss this by phone then he will just f/u in 6mo.  Will forward message to Dr Miles Costain to contact pt.

## 2012-10-31 ENCOUNTER — Ambulatory Visit
Admission: RE | Admit: 2012-10-31 | Discharge: 2012-10-31 | Disposition: A | Discharge status: Home / Self Care | Payer: Medicare Other | Source: Ambulatory Visit | Attending: Radiology | Admitting: Radiology

## 2012-10-31 DIAGNOSIS — N2889 Other specified disorders of kidney and ureter: Secondary | ICD-10-CM

## 2012-10-31 NOTE — Progress Notes (Signed)
Denies hematuria or any other problems with urination.  States that Sx are improving.  Continues to experience minimal discomfort of Right flank.  Takes Aleve 1 tab as needed but states that is infrequently.  Also states that Right abdominal swelling has decreased.    Afebrile.    Able to complete ADLS's without assistance.  Cautious with regard to lifting.  Lalanya Rufener Carmell [place], RN 10/31/2012 3:08 PM

## 2012-11-13 ENCOUNTER — Other Ambulatory Visit: Payer: Medicare Other

## 2012-11-13 ENCOUNTER — Other Ambulatory Visit (HOSPITAL_COMMUNITY): Payer: Medicare Other

## 2012-12-25 ENCOUNTER — Other Ambulatory Visit (HOSPITAL_COMMUNITY): Payer: Self-pay | Admitting: Interventional Radiology

## 2013-01-07 ENCOUNTER — Other Ambulatory Visit: Payer: Self-pay | Admitting: Emergency Medicine

## 2013-01-07 ENCOUNTER — Other Ambulatory Visit (HOSPITAL_COMMUNITY): Payer: Self-pay | Admitting: Interventional Radiology

## 2013-01-07 DIAGNOSIS — N2889 Other specified disorders of kidney and ureter: Secondary | ICD-10-CM

## 2013-01-08 ENCOUNTER — Other Ambulatory Visit (HOSPITAL_COMMUNITY): Payer: Self-pay | Admitting: Interventional Radiology

## 2013-01-09 LAB — CREATININE WITH EST GFR
GFR, Est African American: 63 mL/min
GFR, Est Non African American: 54 mL/min — ABNORMAL LOW

## 2013-01-23 ENCOUNTER — Ambulatory Visit (HOSPITAL_COMMUNITY)
Admission: RE | Admit: 2013-01-23 | Discharge: 2013-01-23 | Disposition: A | Discharge status: Home / Self Care | Payer: Medicare Other | Source: Ambulatory Visit | Attending: Interventional Radiology | Admitting: Interventional Radiology

## 2013-01-23 ENCOUNTER — Encounter (HOSPITAL_COMMUNITY): Payer: Self-pay

## 2013-01-23 ENCOUNTER — Ambulatory Visit
Admission: RE | Admit: 2013-01-23 | Discharge: 2013-01-23 | Disposition: A | Discharge status: Home / Self Care | Payer: Medicare HMO | Source: Ambulatory Visit | Attending: Interventional Radiology | Admitting: Interventional Radiology

## 2013-01-23 DIAGNOSIS — E278 Other specified disorders of adrenal gland: Secondary | ICD-10-CM | POA: Insufficient documentation

## 2013-01-23 DIAGNOSIS — I2510 Atherosclerotic heart disease of native coronary artery without angina pectoris: Secondary | ICD-10-CM | POA: Insufficient documentation

## 2013-01-23 DIAGNOSIS — N2889 Other specified disorders of kidney and ureter: Secondary | ICD-10-CM

## 2013-01-23 DIAGNOSIS — K449 Diaphragmatic hernia without obstruction or gangrene: Secondary | ICD-10-CM | POA: Insufficient documentation

## 2013-01-23 DIAGNOSIS — N200 Calculus of kidney: Secondary | ICD-10-CM | POA: Insufficient documentation

## 2013-01-23 DIAGNOSIS — N289 Disorder of kidney and ureter, unspecified: Secondary | ICD-10-CM | POA: Insufficient documentation

## 2013-01-23 MED ORDER — IOHEXOL 300 MG/ML  SOLN
100.0000 mL | Freq: Once | INTRAMUSCULAR | Status: AC | PRN
  Administered 2013-01-23: 100 mL via INTRAVENOUS

## 2013-01-23 NOTE — Progress Notes (Signed)
Denies hematuria or any other problems with urination.  Denies Right flank pain.  RLQ abdominal swelling has resolved.  Has resumed normal activities.    Overall doing well.     Amadeus Oyama Carmell [place], RN 01/23/2013 3:17 PM

## 2013-06-24 ENCOUNTER — Other Ambulatory Visit (HOSPITAL_COMMUNITY): Payer: Self-pay | Admitting: Interventional Radiology

## 2013-06-24 ENCOUNTER — Other Ambulatory Visit: Payer: Self-pay | Admitting: Emergency Medicine

## 2013-06-24 DIAGNOSIS — N2889 Other specified disorders of kidney and ureter: Secondary | ICD-10-CM

## 2013-07-22 ENCOUNTER — Other Ambulatory Visit (HOSPITAL_COMMUNITY): Payer: Self-pay | Admitting: Interventional Radiology

## 2013-07-22 LAB — CREATININE WITH EST GFR
CREATININE: 1.07 mg/dL (ref 0.50–1.35)
GFR, EST NON AFRICAN AMERICAN: 66 mL/min
GFR, Est African American: 76 mL/min

## 2013-07-22 LAB — BUN: BUN: 17 mg/dL (ref 6–23)

## 2013-07-31 ENCOUNTER — Ambulatory Visit (HOSPITAL_COMMUNITY)
Admission: RE | Admit: 2013-07-31 | Discharge: 2013-07-31 | Disposition: A | Discharge status: Home / Self Care | Payer: Medicare HMO | Source: Ambulatory Visit | Attending: Interventional Radiology | Admitting: Interventional Radiology

## 2013-07-31 ENCOUNTER — Encounter (HOSPITAL_COMMUNITY): Payer: Self-pay

## 2013-07-31 ENCOUNTER — Ambulatory Visit
Admission: RE | Admit: 2013-07-31 | Discharge: 2013-07-31 | Disposition: A | Discharge status: Home / Self Care | Payer: Medicare HMO | Source: Ambulatory Visit | Attending: Interventional Radiology | Admitting: Interventional Radiology

## 2013-07-31 DIAGNOSIS — K5730 Diverticulosis of large intestine without perforation or abscess without bleeding: Secondary | ICD-10-CM | POA: Insufficient documentation

## 2013-07-31 DIAGNOSIS — N200 Calculus of kidney: Secondary | ICD-10-CM | POA: Insufficient documentation

## 2013-07-31 DIAGNOSIS — N289 Disorder of kidney and ureter, unspecified: Secondary | ICD-10-CM | POA: Insufficient documentation

## 2013-07-31 DIAGNOSIS — K449 Diaphragmatic hernia without obstruction or gangrene: Secondary | ICD-10-CM | POA: Insufficient documentation

## 2013-07-31 DIAGNOSIS — N2889 Other specified disorders of kidney and ureter: Secondary | ICD-10-CM

## 2013-07-31 DIAGNOSIS — K7689 Other specified diseases of liver: Secondary | ICD-10-CM | POA: Insufficient documentation

## 2013-07-31 DIAGNOSIS — IMO0002 Reserved for concepts with insufficient information to code with codable children: Secondary | ICD-10-CM | POA: Insufficient documentation

## 2013-07-31 MED ORDER — IOHEXOL 300 MG/ML  SOLN
100.0000 mL | Freq: Once | INTRAMUSCULAR | Status: AC | PRN
  Administered 2013-07-31: 100 mL via INTRAVENOUS

## 2013-07-31 NOTE — Progress Notes (Signed)
Denies hematuria or problems with urination.  Overactive bladder.  Denies pain associated with cryoablation.    Marisal Swarey Riki Rusk, RN 07/31/2013 2:49 PM

## 2014-01-06 ENCOUNTER — Encounter: Payer: Self-pay | Admitting: Radiology

## 2014-01-06 ENCOUNTER — Other Ambulatory Visit: Payer: Self-pay | Admitting: Radiology

## 2014-01-06 ENCOUNTER — Other Ambulatory Visit (HOSPITAL_COMMUNITY): Payer: Self-pay | Admitting: Interventional Radiology

## 2014-01-06 DIAGNOSIS — C641 Malignant neoplasm of right kidney, except renal pelvis: Secondary | ICD-10-CM

## 2014-01-06 DIAGNOSIS — N2889 Other specified disorders of kidney and ureter: Secondary | ICD-10-CM

## 2014-01-21 LAB — CREATININE WITH EST GFR
Creat: 1.12 mg/dL (ref 0.50–1.35)
GFR, EST AFRICAN AMERICAN: 72 mL/min
GFR, Est Non African American: 63 mL/min

## 2014-01-21 LAB — BUN: BUN: 16 mg/dL (ref 6–23)

## 2014-01-30 ENCOUNTER — Other Ambulatory Visit (HOSPITAL_COMMUNITY): Payer: Medicare HMO

## 2014-01-30 ENCOUNTER — Other Ambulatory Visit: Payer: Medicare HMO

## 2014-02-18 ENCOUNTER — Ambulatory Visit
Admission: RE | Admit: 2014-02-18 | Discharge: 2014-02-18 | Disposition: A | Discharge status: Home / Self Care | Payer: Commercial Managed Care - HMO | Source: Ambulatory Visit | Attending: Interventional Radiology | Admitting: Interventional Radiology

## 2014-02-18 ENCOUNTER — Ambulatory Visit (HOSPITAL_COMMUNITY)
Admission: RE | Admit: 2014-02-18 | Discharge: 2014-02-18 | Disposition: A | Discharge status: Home / Self Care | Payer: Medicare HMO | Source: Ambulatory Visit | Attending: Interventional Radiology | Admitting: Interventional Radiology

## 2014-02-18 DIAGNOSIS — N2889 Other specified disorders of kidney and ureter: Secondary | ICD-10-CM

## 2014-02-18 DIAGNOSIS — C641 Malignant neoplasm of right kidney, except renal pelvis: Secondary | ICD-10-CM | POA: Diagnosis not present

## 2014-02-18 MED ORDER — IOHEXOL 300 MG/ML  SOLN
100.0000 mL | Freq: Once | INTRAMUSCULAR | Status: AC | PRN
  Administered 2014-02-18: 100 mL via INTRAVENOUS

## 2014-02-18 NOTE — Progress Notes (Signed)
Patient ID: Dylan Harris, male   DOB: Jan 15, 1934, 80 y.o.   MRN: [id number]   Chief Complaint: Right renal cell carcinoma, 18 months status post cryoablation, close surveillance of a second low-grade right renal neoplasm.   Referring Physician(s): [place]   History of Present Illness: Dylan Harris is a 80 y.o. male who is 18 months status post cryoablation of a 1.9 cm exophytic posterior right renal mass consistent with a renal cell carcinoma. He returns for six-month followup. Over the last 6 months he has been doing very well. He continues to be asymptomatic. No current abdominal pain, flank pain, fevers, chills, hematuria, or dysuria. Stable weight. No significant change in urinary tract symptoms. He continues to do very well. He is followed closely by Dr. Felipa Eth in [facility].  Past Medical History  Diagnosis Date  . Coronary artery disease     2 stents  . Hypertension   . GERD (gastroesophageal reflux disease)   . Cancer 1990    prostate  . Chronic kidney disease     many times  . Depression     mild, on Zoloft  . H/O hiatal hernia   . Hypercholesteremia   . History of kidney stones   . Arthritis   . Complication of anesthesia     general anesthesia- severe vomiting, woke up during surgery vomiting and for 13 hours after  . PONV (postoperative nausea and vomiting)     Past Surgical History  Procedure Laterality Date  . Prostatectomy  1990  . Knee arthroplasty  2009    medial half of right knee replaced  . Cardiac catheterization    . Coronary angioplasty  2007    2 stents placed  . Esophagus stretch  2014  . Tonsillectomy    . Lithotripsy    . Eye surgery Bilateral     cataract    Allergies: Review of patient's allergies indicates no known allergies.  Medications: Prior to Admission medications   Medication Sig Start Date End Date Taking? Authorizing Provider  amLODipine (NORVASC) 10 MG tablet Take 10 mg by mouth every morning.    Yes Historical  Provider, MD  aspirin 325 MG tablet Take 325 mg by mouth every evening.    Yes Historical Provider, MD  hydrochlorothiazide (HYDRODIURIL) 25 MG tablet Take 25 mg by mouth every morning.    Yes Historical Provider, MD  losartan (COZAAR) 100 MG tablet Take 100 mg by mouth every morning.    Yes Historical Provider, MD  Multiple Vitamin (MULTIVITAMIN) tablet Take 1 tablet by mouth daily.   Yes Historical Provider, MD  Multiple Vitamins-Minerals (ICAPS) CAPS Take 1 capsule by mouth 2 (two) times daily.   Yes Historical Provider, MD  Nebivolol HCl (BYSTOLIC) 20 MG TABS Take 20 mg by mouth every morning.   Yes Historical Provider, MD  potassium chloride (K-DUR) 10 MEQ tablet Take 10 mEq by mouth 2 (two) times daily.   Yes Historical Provider, MD  sertraline (ZOLOFT) 100 MG tablet Take 100 mg by mouth every morning.    Yes Historical Provider, MD  simvastatin (ZOCOR) 40 MG tablet Take 40 mg by mouth every evening.   Yes Historical Provider, MD  niacin (NIASPAN) 500 MG CR tablet Take 1,000 mg by mouth every evening.     Historical Provider, MD  zolpidem (AMBIEN) 10 MG tablet Take 10 mg by mouth at bedtime as needed for sleep. Patient takes 1/2 tablet    Historical Provider, MD    No family  history on file.  History   Social History  . Marital Status: Married    Spouse Name: N/A    Number of Children: N/A  . Years of Education: N/A   Social History Main Topics  . Smoking status: Never Smoker   . Smokeless tobacco: Never Used  . Alcohol Use: No  . Drug Use: No  . Sexual Activity: Not on file   Other Topics Concern  . Not on file   Social History Narrative  . No narrative on file    Review of Systems: A 12 point ROS discussed and pertinent positives are indicated in the HPI above.  All other systems are negative.  Review of Systems  Constitutional: Negative for fever, chills and activity change.  Respiratory: Negative for chest tightness and shortness of breath.   Cardiovascular:  Negative for chest pain.  Gastrointestinal: Negative for abdominal distention.  Genitourinary: Negative for dysuria, hematuria and flank pain.    Vital Signs: BP 164/86  Pulse 62  Temp(Src) 97.7 F (36.5 C) (Oral)  Resp 15  SpO2 97%  Physical Exam  Constitutional: He appears well-developed and well-nourished. No distress.  Abdominal: Soft. Bowel sounds are normal. He exhibits no distension.  No CVAT   Skin: He is not diaphoretic.    Imaging: Ct Abd Wo & W Cm  02/18/2014   CLINICAL DATA:  Right renal cell cancer cryoablation, followup. Diagnosis 2014.  EXAM: CT ABDOMEN WITHOUT AND WITH CONTRAST  TECHNIQUE: Multidetector CT imaging of the abdomen was performed following the standard protocol before and following the bolus administration of intravenous contrast.  CONTRAST:  15mL OMNIPAQUE IOHEXOL 300 MG/ML  SOLN  COMPARISON:  07/31/2013  FINDINGS: Lung bases are clear. No effusions. Heart is normal size. Small hiatal hernia, stable.  Small hypodensity peripherally in the right hepatic lobe with overlying capsular retraction again noted, stable, likely a benign process. Spleen, pancreas, adrenals, gallbladder, stomach unremarkable.  Precontrast images demonstrate small bilateral nonobstructing renal calculi, stable. No ureteral stones or hydronephrosis.  Ablation defect noted in the interpolar region of the right kidney posteriorly with decreasing size/caustic acuity since prior study.  Right lower pole enhancing renal lesion again noted measuring 2.7 x 1.5 cm compared with 2.7 x 1.9 cm previously. This is measured on image 33 of series 7 of today's study. Small hypodense area noted in the posterior lower pole of the right kidney. This is difficult to visualize on today's study, not as well seen as on prior study, but measures approximately 12 mm compared with 12 mm previously.  Hypo attenuating area noted posteriorly in the left mid to upper pole measures 15 mm compared with 14 mm previously,  not significantly changed. This is measured on image 19 of series 7. Multiple bilateral parapelvic renal cysts and other scattered hypodensities/cystic areas are stable.  Visualized large and small bowel unremarkable except for scattered colonic diverticula. Aorta is calcified, non aneurysmal. No free fluid or free air.  No acute bony abnormality or focal bone lesion.  IMPRESSION: Further retraction of the post ablation site in the right interpolar region posteriorly.  Several other hyperdense/ enhancing lesions within the kidneys bilaterally are unchanged since prior study.  Bilateral parapelvic and scattered renal cysts.  These are stable.   Electronically Signed   By: Rolm Baptise M.D.   On: 02/18/2014 10:57    Labs:  BMP:  Recent Labs  07/22/13 0831 01/21/14 1226  BUN 17 16  CREATININE 1.07 1.12  GFRNONAA 66 63  GFRAA  76 72    Assessment and Plan:  18 months status post cryoablation of a 1.9 cm right renal cell carcinoma. Stable CT exam demonstrating continued retraction of the cryoablation defect.  Stable 2.7 x 1.5 cm second right renal mass dating back to July 2014. This remains compatible with a second right renal low-grade neoplasm.   Recommend continued close surveillance of the second solid right renal low-grade neoplasm. Repeat CT with contrast in 6 months. Patient is in agreement with this plan.     I spent a total of 30 minutes face to face in clinical consultation, greater than 50% of which was counseling/coordinating care for the patient  Signed:. Lilit Cinelli T. 02/18/2014, 1:05 PM

## 2014-07-04 ENCOUNTER — Encounter: Payer: Self-pay | Admitting: Radiology

## 2014-07-04 ENCOUNTER — Other Ambulatory Visit: Payer: Self-pay | Admitting: Radiology

## 2014-07-04 ENCOUNTER — Other Ambulatory Visit (HOSPITAL_COMMUNITY): Payer: Self-pay | Admitting: Interventional Radiology

## 2014-07-04 DIAGNOSIS — N2889 Other specified disorders of kidney and ureter: Secondary | ICD-10-CM

## 2014-08-12 LAB — CREATININE WITH EST GFR
Creat: 1.14 mg/dL (ref 0.50–1.35)
GFR, EST AFRICAN AMERICAN: 70 mL/min
GFR, EST NON AFRICAN AMERICAN: 61 mL/min

## 2014-08-12 LAB — BUN: BUN: 15 mg/dL (ref 6–23)

## 2014-08-27 ENCOUNTER — Ambulatory Visit (HOSPITAL_COMMUNITY)
Admission: RE | Admit: 2014-08-27 | Discharge: 2014-08-27 | Disposition: A | Discharge status: Home / Self Care | Payer: Medicare HMO | Source: Ambulatory Visit | Attending: Interventional Radiology | Admitting: Interventional Radiology

## 2014-08-27 ENCOUNTER — Encounter (HOSPITAL_COMMUNITY): Payer: Self-pay

## 2014-08-27 ENCOUNTER — Ambulatory Visit
Admission: RE | Admit: 2014-08-27 | Discharge: 2014-08-27 | Disposition: A | Discharge status: Home / Self Care | Payer: Commercial Managed Care - HMO | Source: Ambulatory Visit | Attending: Interventional Radiology | Admitting: Interventional Radiology

## 2014-08-27 DIAGNOSIS — Z8546 Personal history of malignant neoplasm of prostate: Secondary | ICD-10-CM | POA: Diagnosis not present

## 2014-08-27 DIAGNOSIS — N2889 Other specified disorders of kidney and ureter: Secondary | ICD-10-CM | POA: Insufficient documentation

## 2014-08-27 MED ORDER — IOHEXOL 300 MG/ML  SOLN
100.0000 mL | Freq: Once | INTRAMUSCULAR | Status: AC | PRN
  Administered 2014-08-27: 100 mL via INTRAVENOUS

## 2014-08-27 NOTE — Progress Notes (Signed)
Patient ID: Dylan Harris, male   DOB: May 24, 1933, 81 y.o.   MRN: [id number]     Chief Complaint: Chief Complaint  Patient presents with  . Follow-up    2 yr follow up Cryoablation of Right Renal Mass     Referring Physician(s): Michaelle Birks  History of Present Illness: Dylan Harris is a 81 y.o. male who is now 23 months status post cryoablation of a 1.9 cm right renal cell carcinoma. He returns for six-month outpatient follow-up. He continues to do very well. He remains asymptomatic. No current abdominal pain, flank pain, CVA tenderness, fevers, chills, hematuria, or dysuria. Stable weight. No significant change in urinary tract symptoms or urgency. He is closely followed by Dr. Felipa Eth at [facility].  Past Medical History  Diagnosis Date  . Coronary artery disease     2 stents  . Hypertension   . GERD (gastroesophageal reflux disease)   . Cancer 1990    prostate  . Chronic kidney disease     many times  . Depression     mild, on Zoloft  . H/O hiatal hernia   . Hypercholesteremia   . History of kidney stones   . Arthritis   . Complication of anesthesia     general anesthesia- severe vomiting, woke up during surgery vomiting and for 13 hours after  . PONV (postoperative nausea and vomiting)     Past Surgical History  Procedure Laterality Date  . Prostatectomy  1990  . Knee arthroplasty  2009    medial half of right knee replaced  . Cardiac catheterization    . Coronary angioplasty  2007    2 stents placed  . Esophagus stretch  2014  . Tonsillectomy    . Lithotripsy    . Eye surgery Bilateral     cataract    Allergies: Review of patient's allergies indicates no known allergies.  Medications: Prior to Admission medications   Medication Sig Start Date End Date Taking? Authorizing Provider  amLODipine (NORVASC) 10 MG tablet Take 10 mg by mouth every morning.    Yes Historical Provider, MD  aspirin 325 MG tablet Take 325 mg by mouth every evening.    Yes  Historical Provider, MD  hydrochlorothiazide (HYDRODIURIL) 25 MG tablet Take 25 mg by mouth every morning.    Yes Historical Provider, MD  losartan (COZAAR) 100 MG tablet Take 100 mg by mouth every morning.    Yes Historical Provider, MD  Multiple Vitamin (MULTIVITAMIN) tablet Take 1 tablet by mouth daily.   Yes Historical Provider, MD  Nebivolol HCl (BYSTOLIC) 20 MG TABS Take 20 mg by mouth every morning.   Yes Historical Provider, MD  potassium chloride (K-DUR) 10 MEQ tablet Take 10 mEq by mouth 2 (two) times daily.   Yes Historical Provider, MD  sertraline (ZOLOFT) 100 MG tablet Take 100 mg by mouth every morning.    Yes Historical Provider, MD  simvastatin (ZOCOR) 40 MG tablet Take 40 mg by mouth every evening.   Yes Historical Provider, MD  zolpidem (AMBIEN) 10 MG tablet Take 10 mg by mouth at bedtime as needed for sleep. Patient takes 1/2 tablet   Yes Historical Provider, MD  Multiple Vitamins-Minerals (ICAPS) CAPS Take 1 capsule by mouth 2 (two) times daily.    Historical Provider, MD  niacin (NIASPAN) 500 MG CR tablet Take 1,000 mg by mouth every evening.     Historical Provider, MD     No family history on file.  History  Social History  . Marital Status: Married    Spouse Name: N/A  . Number of Children: N/A  . Years of Education: N/A   Social History Main Topics  . Smoking status: Never Smoker   . Smokeless tobacco: Never Used  . Alcohol Use: No  . Drug Use: No  . Sexual Activity: Not on file   Other Topics Concern  . Not on file   Social History Narrative    Review of Systems: A 12 point ROS discussed and pertinent positives are indicated in the HPI above.  All other systems are negative.  Review of Systems  Constitutional: Negative for fever, chills, diaphoresis, activity change and appetite change.  Respiratory: Negative for cough and chest tightness.   Cardiovascular: Negative for chest pain and palpitations.  Gastrointestinal: Negative for abdominal  distention.  Genitourinary: Positive for frequency. Negative for dysuria, hematuria and flank pain.    Vital Signs: BP 153/83 mmHg  Pulse 63  Temp(Src) 98 F (36.7 C) (Oral)  Resp 14  Ht 5\' 10"  (1.778 m)  Wt 195 lb (88.451 kg)  BMI 27.98 kg/m2  SpO2 96%  Physical Exam  Constitutional: He appears well-developed and well-nourished. No distress.  Cardiovascular: Normal rate, regular rhythm and normal heart sounds.   No murmur heard. Pulmonary/Chest: Effort normal and breath sounds normal. No respiratory distress. He has no wheezes.  Abdominal: Soft. Bowel sounds are normal. He exhibits no distension.  No flank pain or CVA tenderness.  Skin: Skin is warm. He is diaphoretic. No erythema. No pallor.  Psychiatric: He has a normal mood and affect. His behavior is normal. Judgment and thought content normal.    Imaging: Ct Abd Wo & W Cm  08/27/2014   CLINICAL DATA:  Renal cryoablation June 2014, surveillance of second right renal mass, history of prostate cancer.  EXAM: CT ABDOMEN WITHOUT AND WITH CONTRAST  TECHNIQUE: Multidetector CT imaging of the abdomen was performed following the standard protocol before and following the bolus administration of intravenous contrast.  CONTRAST:  135mL OMNIPAQUE IOHEXOL 300 MG/ML  SOLN  COMPARISON:  CT abdomen pelvis 02/18/2014 and MR abdomen 07/13/2012.  FINDINGS: Lower chest: Lung bases show scarring in the right middle lobe and lingula. Heart size normal. No pericardial or pleural effusion.  Hepatobiliary: Liver margin appears mildly irregular. 6 mm low-attenuation lesion in the inferior right hepatic lobe is too small to definitively characterize but is unchanged from prior exams and likely a small cyst or hemangioma. Gallbladder is unremarkable. No biliary ductal dilatation.  Pancreas: Negative.  Spleen: Splenule, negative.  Adrenals/Urinary Tract: There may be an 11 mm low-density adenoma in the medial limb right adrenal gland. Adrenal glands are  otherwise unremarkable. Stones are seen in the kidneys bilaterally. Mixed wispy soft tissue and fat density area along the lateral margin of the interpolar right kidney (series 5, image 56) is consistent with previous cryoablation. No nodular or enhancing components. Appearance is unchanged from 02/18/2014. A 1.8 x 2.7 cm enhancing lesion in the lower pole right kidney (series 9, image 25) is unchanged in size from 07/13/2012. A 1 cm enhancing lesion in the posterior aspect of the lower pole right kidney (best seen on series 9, image 26) is also likely stable. A 1.5 cm exophytic lesion off the upper pole left kidney measures 44 Hounsfield units on precontrast imaging and up to 58 Hounsfield units on postcontrast imaging. Size is stable from 02/18/2014 an lesion has been previously characterized as a Bosniak 3 cyst. A 1.4  cm enhancing lesion in the posterior upper pole left kidney (series 9, image 12) is stable. Additional low-attenuation lesions measure up to 12 mm off the anterior lower pole left kidney and are most likely cysts. Bilateral renal sinus cysts are noted as well.  Stomach/Bowel: Tiny hiatal hernia. Stomach is decompressed. Visualized portions of the small bowel and colon are unremarkable.  Vascular/Lymphatic: Atherosclerotic calcification of the arterial vasculature without abdominal aortic aneurysm. No pathologically enlarged lymph nodes.  Other: Mesenteries and peritoneum are unremarkable.  Musculoskeletal: No worrisome lytic or sclerotic lesions.  IMPRESSION: 1. Stable cryoablation defect along the lateral aspect of the interpolar right kidney. 2. Enhancing lesions in the kidneys bilaterally, grossly stable from the prior exam. Findings remain worrisome for indolent renal cell carcinoma. 3. Liver margin appears mildly irregular, raising suspicion for mild/early cirrhosis. 4. Question tiny right adrenal adenoma. 5. Bilateral renal stones.   Electronically Signed   By: Lorin Picket M.D.   On:  08/27/2014 08:55    Labs:  BMP:  Recent Labs  01/21/14 1226 08/11/14 0937  BUN 16 15  CREATININE 1.12 1.14  GFRNONAA 63 61  GFRAA 72 70    Assessment and Plan:  23 months status post cryoablation of a 1.9 cm right renal cell carcinoma. Stable CT exam demonstrating further retraction of the ablation defect.  Stable 2.7 cm second enhancing right renal mass dating back to July 2014. Again, this remains compatible with a second right renal low-grade neoplasm.  Stable creatinine 1.14.  Recommend continue surveillance of the ablation defect and the second solid right renal low-grade neoplasm at 6 months. If he continues to be stable at that time recommend extending surveillance to 12 months. Patient is in agreement with this plan.  Thank you for this interesting consult.  I greatly enjoyed meeting Dylan Harris and look forward to participating in their care.  SignedGreggory Keen 08/27/2014, 10:48 AM   I spent a total of    15 Minutes in face to face in clinical consultation, greater than 50% of which was counseling/coordinating care for this patient with right renal cell carcinoma.

## 2015-02-16 ENCOUNTER — Other Ambulatory Visit (HOSPITAL_COMMUNITY): Payer: Self-pay | Admitting: Interventional Radiology

## 2015-02-16 ENCOUNTER — Other Ambulatory Visit: Payer: Self-pay | Admitting: Radiology

## 2015-02-16 DIAGNOSIS — C641 Malignant neoplasm of right kidney, except renal pelvis: Secondary | ICD-10-CM

## 2015-02-16 DIAGNOSIS — N2889 Other specified disorders of kidney and ureter: Secondary | ICD-10-CM

## 2015-02-20 LAB — CREATININE WITH EST GFR
CREATININE: 1.29 mg/dL — AB (ref 0.70–1.18)
GFR, EST NON AFRICAN AMERICAN: 52 mL/min — AB (ref >=60)
GFR, Est African American: 61 mL/min (ref >=60)

## 2015-02-20 LAB — BUN: BUN: 16 mg/dL (ref 7–25)

## 2015-03-05 ENCOUNTER — Ambulatory Visit
Admission: RE | Admit: 2015-03-05 | Discharge: 2015-03-05 | Disposition: A | Discharge status: Home / Self Care | Payer: Medicare HMO | Source: Ambulatory Visit | Attending: Interventional Radiology | Admitting: Interventional Radiology

## 2015-03-05 ENCOUNTER — Ambulatory Visit (HOSPITAL_COMMUNITY)
Admission: RE | Admit: 2015-03-05 | Discharge: 2015-03-05 | Disposition: A | Discharge status: Home / Self Care | Payer: Medicare HMO | Source: Ambulatory Visit | Attending: Interventional Radiology | Admitting: Interventional Radiology

## 2015-03-05 DIAGNOSIS — N2889 Other specified disorders of kidney and ureter: Secondary | ICD-10-CM | POA: Diagnosis not present

## 2015-03-05 DIAGNOSIS — K449 Diaphragmatic hernia without obstruction or gangrene: Secondary | ICD-10-CM | POA: Diagnosis not present

## 2015-03-05 DIAGNOSIS — K5730 Diverticulosis of large intestine without perforation or abscess without bleeding: Secondary | ICD-10-CM | POA: Diagnosis not present

## 2015-03-05 DIAGNOSIS — I708 Atherosclerosis of other arteries: Secondary | ICD-10-CM | POA: Diagnosis not present

## 2015-03-05 DIAGNOSIS — I2510 Atherosclerotic heart disease of native coronary artery without angina pectoris: Secondary | ICD-10-CM | POA: Diagnosis not present

## 2015-03-05 DIAGNOSIS — D1803 Hemangioma of intra-abdominal structures: Secondary | ICD-10-CM | POA: Diagnosis not present

## 2015-03-05 DIAGNOSIS — M5136 Other intervertebral disc degeneration, lumbar region: Secondary | ICD-10-CM | POA: Insufficient documentation

## 2015-03-05 DIAGNOSIS — N200 Calculus of kidney: Secondary | ICD-10-CM | POA: Diagnosis not present

## 2015-03-05 MED ORDER — IOHEXOL 300 MG/ML  SOLN
100.0000 mL | Freq: Once | INTRAMUSCULAR | Status: AC | PRN
  Administered 2015-03-05: 100 mL via INTRAVENOUS

## 2015-03-05 NOTE — Progress Notes (Signed)
Patient ID: Dylan Harris, male   DOB: [birth year], 81 y.o.   MRN: [id number]       Chief Complaint: Patient was seen in consultation today for  Chief Complaint  Patient presents with  . Follow-up    2.5 yr follow up Cryoablation of Right Renal Mass    Referring Physician(s): [place]  History of Present Illness: OBE CORZO is a 81 y.o. male who is now 2.5 year status post cryoablation of a 1.9 cm right renal cell carcinoma. He returns for outpatient follow-up regularly at 6 months. He continues to do very well. No current abdominal pain, flank pain, CVA tenderness, fever, chills, hematuria or dysuria. He remains asymptomatic. Stable weight. No significant change in urinary symptoms. He is followed by Dr. Felipa Eth at [facility]. He returns to review surveillance imaging.  Past Medical History  Diagnosis Date  . Coronary artery disease     2 stents  . Hypertension   . GERD (gastroesophageal reflux disease)   . Cancer [facility]) 1990    prostate  . Chronic kidney disease     many times  . Depression     mild, on Zoloft  . H/O hiatal hernia   . Hypercholesteremia   . History of kidney stones   . Arthritis   . Complication of anesthesia     general anesthesia- severe vomiting, woke up during surgery vomiting and for 13 hours after  . PONV (postoperative nausea and vomiting)     Past Surgical History  Procedure Laterality Date  . Prostatectomy  1990  . Knee arthroplasty  2009    medial half of right knee replaced  . Cardiac catheterization    . Coronary angioplasty  2007    2 stents placed  . Esophagus stretch  2014  . Tonsillectomy    . Lithotripsy    . Eye surgery Bilateral     cataract    Allergies: Review of patient's allergies indicates no known allergies.  Medications: Prior to Admission medications   Medication Sig Start Date End Date Taking? Authorizing Provider  amLODipine (NORVASC) 10 MG tablet Take 10 mg by mouth every morning.    Yes Historical  Provider, MD  aspirin 325 MG tablet Take 325 mg by mouth every evening.    Yes Historical Provider, MD  hydrochlorothiazide (HYDRODIURIL) 25 MG tablet Take 25 mg by mouth every morning.    Yes Historical Provider, MD  losartan (COZAAR) 100 MG tablet Take 100 mg by mouth every morning.    Yes Historical Provider, MD  Multiple Vitamin (MULTIVITAMIN) tablet Take 1 tablet by mouth daily.   Yes Historical Provider, MD  Multiple Vitamins-Minerals (ICAPS) CAPS Take 1 capsule by mouth 2 (two) times daily.   Yes Historical Provider, MD  Nebivolol HCl (BYSTOLIC) 20 MG TABS Take 20 mg by mouth every morning.   Yes Historical Provider, MD  potassium chloride (K-DUR) 10 MEQ tablet Take 10 mEq by mouth 2 (two) times daily.   Yes Historical Provider, MD  sertraline (ZOLOFT) 100 MG tablet Take 100 mg by mouth every morning.    Yes Historical Provider, MD  simvastatin (ZOCOR) 40 MG tablet Take 40 mg by mouth every evening.   Yes Historical Provider, MD  zolpidem (AMBIEN) 10 MG tablet Take 10 mg by mouth at bedtime as needed for sleep. Patient takes 1/2 tablet   Yes Historical Provider, MD  niacin (NIASPAN) 500 MG CR tablet Take 1,000 mg by mouth every evening.     Historical  Provider, MD     No family history on file.  Social History   Social History  . Marital Status: Married    Spouse Name: N/A  . Number of Children: N/A  . Years of Education: N/A   Social History Main Topics  . Smoking status: Never Smoker   . Smokeless tobacco: Never Used  . Alcohol Use: No  . Drug Use: No  . Sexual Activity: Not Asked   Other Topics Concern  . None   Social History Narrative    Review of Systems: A 12 point ROS discussed and pertinent positives are indicated in the HPI above.  All other systems are negative.  Review of Systems  Vital Signs: BP 146/78 mmHg  Pulse 68  Temp(Src) 97.9 F (36.6 C) (Oral)  SpO2 98%  Physical Exam  Constitutional: He appears well-developed and well-nourished. No  distress.  Cardiovascular: Normal rate and regular rhythm.   No murmur heard. Pulmonary/Chest: Effort normal and breath sounds normal. No respiratory distress. He has no wheezes.  Abdominal: Soft. Bowel sounds are normal. He exhibits no distension and no mass. There is no tenderness. No hernia.  Skin: He is not diaphoretic.  Psychiatric: He has a normal mood and affect. His behavior is normal.    Imaging: Ct Abd Wo & W Cm  03/05/2015  CLINICAL DATA:  Cryoablation of right renal cell carcinoma 10/05/2012. Surveillance of a second right renal mass. Remote history prostate cancer. EXAM: CT ABDOMEN WITHOUT AND WITH CONTRAST TECHNIQUE: Multidetector CT imaging of the abdomen was performed following the standard protocol before and following the bolus administration of intravenous contrast. CONTRAST:  166mL OMNIPAQUE IOHEXOL 300 MG/ML  SOLN COMPARISON:  08/27/2014 FINDINGS: Lower chest:  Coronary atherosclerosis. Hepatobiliary: 8 mm hypodense lesion in segment 6 of the liver, image 45 series 4, stable. The previously shown to be a hemangioma on 07/13/2012. Pancreas: Unremarkable Spleen: Unremarkable Adrenals/Urinary Tract: Adrenal glands unremarkable. Bilateral nonobstructive renal calculi. The largest cluster on the right measures 9 mm in long axis in the largest left-sided calculus measures 8 mm in long axis. Bilateral extrarenal pelvis. No hydroureter or proximal ureteral calculi. Typical cryoablation halo noted along the periphery of the posterior right kidney on image 58 series 9, without immediate local recurrence at the site. However, there is a 2.7 by 1.8 cm solid mass along the right kidney lower pole renal hilum which is clearly enhancing and compatible with malignancy. Potentially enhancing 0.9 cm lesion of the right kidney upper pole laterally is hypodense but measures 1 Hounsfield units precontrast and 32 Hounsfield units in the arterial phase. Accordingly this could be a malignancy but is septic  to volume averaging. Exophytic from the left kidney upper pole there is a 1.5 by 1.4 cm lesion with high precontrast density, increasing from 50 Hounsfield units precontrast to 81 Hounsfield units on portal venous phase images. A cystic lesion of the left kidney lower pole anteriorly does not appear to be significantly enhancing. Posteriorly in the left kidney upper pole, a 1.5 by 1.5 cm lesion is visible only on the delayed images such as image 33 series 5, and is likewise concerning for a potential renal mass. This previously measured 1.4 cm in diameter. Stomach/Bowel: Small type 1 hiatal hernia. Scattered colonic diverticula. Vascular/Lymphatic: Aortoiliac atherosclerotic vascular disease. No periaortic adenopathy. Other: No supplemental non-categorized findings. Musculoskeletal: Lumbar degenerative disc disease. IMPRESSION: 1. No direct recurrence at the ablation site, but multiple other enhancing lesions in the kidneys are concerning for renal cell  carcinomas. The largest and most obvious is a stable 2.7 by 1.8 cm solid mass along the right kidney lower pole renal hilum. An apparently enhancing 0.9 cm lesion of the right kidney upper pole is observed but conceivably could be from volume averaging rather than true enhancement. In the left kidney, there are 2 lesions suspected of enhancing and concerning for small solid renal masses. The various renal masses appear stable. 2. Bilateral nonobstructive renal calculi. 3. Other imaging findings of potential clinical significance: Hepatic hemangioma. Coronary atherosclerosis. Small type 1 hiatal hernia. Scattered colonic diverticula. Aortoiliac atherosclerotic vascular disease. Lumbar degenerative disc disease. Electronically Signed   By: Van Clines M.D.   On: 03/05/2015 09:03     BMP:  Recent Labs  08/11/14 0937 02/19/15 0847  BUN 15 16  CREATININE 1.14 1.29*  GFRNONAA 61 52*  GFRAA 70 61    Assessment and Plan:  2.5 years status post  cryoablation of a 1.9 cm right renal cell carcinoma. Stable CT exam demonstrating continue retraction of the ablation defect. No evidence of residual or recurrent tumor at the treated site.  Stable 2.7 cm second enhancing right renal interpolar mass extending into the hilum dating back to July 2014. No significant interval change.  Stable creatinine 1.29.  Extending surveillance imaging to 12 months.  Thank you for this interesting consult.  I greatly enjoyed meeting SHAKIL BODNER and look forward to participating in their care.  A copy of this report was sent to the requesting provider on this date.  SignedGreggory Keen 03/05/2015, 10:52 AM   I spent a total of    25 Minutes in face to face in clinical consultation, greater than 50% of which was counseling/coordinating care for this patient with renal cell carcinoma.

## 2016-02-18 ENCOUNTER — Other Ambulatory Visit (HOSPITAL_COMMUNITY): Payer: Self-pay | Admitting: Interventional Radiology

## 2016-02-18 DIAGNOSIS — N2889 Other specified disorders of kidney and ureter: Secondary | ICD-10-CM

## 2016-02-18 DIAGNOSIS — C641 Malignant neoplasm of right kidney, except renal pelvis: Secondary | ICD-10-CM

## 2016-03-09 ENCOUNTER — Other Ambulatory Visit: Payer: Self-pay | Admitting: *Deleted

## 2016-03-09 DIAGNOSIS — C641 Malignant neoplasm of right kidney, except renal pelvis: Secondary | ICD-10-CM

## 2016-03-10 ENCOUNTER — Other Ambulatory Visit (HOSPITAL_COMMUNITY): Payer: Self-pay | Admitting: Interventional Radiology

## 2016-03-10 ENCOUNTER — Ambulatory Visit
Admission: RE | Admit: 2016-03-10 | Discharge: 2016-03-10 | Disposition: A | Discharge status: Home / Self Care | Payer: Medicare HMO | Source: Ambulatory Visit | Attending: Interventional Radiology | Admitting: Interventional Radiology

## 2016-03-10 ENCOUNTER — Encounter (HOSPITAL_COMMUNITY): Payer: Self-pay

## 2016-03-10 ENCOUNTER — Ambulatory Visit (HOSPITAL_COMMUNITY)
Admission: RE | Admit: 2016-03-10 | Discharge: 2016-03-10 | Disposition: A | Discharge status: Home / Self Care | Payer: Medicare HMO | Source: Ambulatory Visit | Attending: Interventional Radiology | Admitting: Interventional Radiology

## 2016-03-10 DIAGNOSIS — K8020 Calculus of gallbladder without cholecystitis without obstruction: Secondary | ICD-10-CM | POA: Insufficient documentation

## 2016-03-10 DIAGNOSIS — I2510 Atherosclerotic heart disease of native coronary artery without angina pectoris: Secondary | ICD-10-CM | POA: Diagnosis not present

## 2016-03-10 DIAGNOSIS — K449 Diaphragmatic hernia without obstruction or gangrene: Secondary | ICD-10-CM | POA: Insufficient documentation

## 2016-03-10 DIAGNOSIS — C641 Malignant neoplasm of right kidney, except renal pelvis: Secondary | ICD-10-CM

## 2016-03-10 DIAGNOSIS — I700 Atherosclerosis of aorta: Secondary | ICD-10-CM | POA: Insufficient documentation

## 2016-03-10 DIAGNOSIS — K5730 Diverticulosis of large intestine without perforation or abscess without bleeding: Secondary | ICD-10-CM | POA: Insufficient documentation

## 2016-03-10 HISTORY — PX: IR GENERIC HISTORICAL: IMG1180011

## 2016-03-10 LAB — POCT I-STAT CREATININE: Creatinine, Ser: 1.2 mg/dL (ref 0.61–1.24)

## 2016-03-10 MED ORDER — IOPAMIDOL (ISOVUE-300) INJECTION 61%
100.0000 mL | Freq: Once | INTRAVENOUS | Status: AC | PRN
  Administered 2016-03-10: 100 mL via INTRAVENOUS

## 2016-03-10 MED ORDER — IOPAMIDOL (ISOVUE-300) INJECTION 61%
INTRAVENOUS | Status: AC
  Filled 2016-03-10: qty 100

## 2016-03-10 NOTE — Progress Notes (Signed)
Patient ID: Dylan Harris, male   DOB: 1933/08/02, 82 y.o.   MRN: [id number]       Chief Complaint: 3.5 years status post right renal cell carcinoma cryoablation.  Referring Physician(s): Maicol Bowland  History of Present Illness: Dylan Harris is a 82 y.o. male who is now 3.5 years status post cryoablation of a 1.9 cm right renal cell carcinoma. He returns for outpatient follow-up annually. He continues to do very well. No current flank pain, abdominal pain, fever, chills, hematuria, dysuria, or change in urinary habits. He remains asymptomatic. Stable weight and appetite. He is followed by Dr. Felipa Eth at [facility]. He returns to review his annual surveillance imaging.  Past Medical History:  Diagnosis Date  . Arthritis   . Cancer [facility]) 1990   prostate  . Chronic kidney disease    Harris times  . Complication of anesthesia    general anesthesia- severe vomiting, woke up during surgery vomiting and for 13 hours after  . Coronary artery disease    2 stents  . Depression    mild, on Zoloft  . GERD (gastroesophageal reflux disease)   . H/O hiatal hernia   . History of kidney stones   . Hypercholesteremia   . Hypertension   . PONV (postoperative nausea and vomiting)     Past Surgical History:  Procedure Laterality Date  . CARDIAC CATHETERIZATION    . CORONARY ANGIOPLASTY  2007   2 stents placed  . esophagus stretch  2014  . EYE SURGERY Bilateral    cataract  . KNEE ARTHROPLASTY  2009   medial half of right knee replaced  . LITHOTRIPSY    . PROSTATECTOMY  1990  . TONSILLECTOMY      Allergies: Patient has no known allergies.  Medications: Prior to Admission medications   Medication Sig Start Date End Date Taking? Authorizing Provider  amLODipine (NORVASC) 10 MG tablet Take 10 mg by mouth every morning.    Yes Historical Provider, MD  aspirin 325 MG tablet Take 325 mg by mouth every evening.    Yes Historical Provider, MD  hydrochlorothiazide (HYDRODIURIL) 25 MG  tablet Take 25 mg by mouth every morning.    Yes Historical Provider, MD  losartan (COZAAR) 100 MG tablet Take 100 mg by mouth every morning.    Yes Historical Provider, MD  Multiple Vitamin (MULTIVITAMIN) tablet Take 1 tablet by mouth daily.   Yes Historical Provider, MD  Multiple Vitamins-Minerals (ICAPS) CAPS Take 1 capsule by mouth 2 (two) times daily.   Yes Historical Provider, MD  Nebivolol HCl (BYSTOLIC) 20 MG TABS Take 20 mg by mouth every morning.   Yes Historical Provider, MD  potassium chloride (K-DUR) 10 MEQ tablet Take 10 mEq by mouth 2 (two) times daily.   Yes Historical Provider, MD  sertraline (ZOLOFT) 100 MG tablet Take 100 mg by mouth every morning.    Yes Historical Provider, MD  simvastatin (ZOCOR) 40 MG tablet Take 40 mg by mouth every evening.   Yes Historical Provider, MD  zolpidem (AMBIEN) 10 MG tablet Take 10 mg by mouth at bedtime as needed for sleep. Patient takes 1/2 tablet   Yes Historical Provider, MD  niacin (NIASPAN) 500 MG CR tablet Take 1,000 mg by mouth every evening.     Historical Provider, MD     No family history on file.  Social History   Social History  . Marital status: Married    Spouse name: N/A  . Number of children: N/A  .  Years of education: N/A   Social History Main Topics  . Smoking status: Never Smoker  . Smokeless tobacco: Never Used  . Alcohol use No  . Drug use: No  . Sexual activity: Not on file   Other Topics Concern  . Not on file   Social History Narrative  . No narrative on file     Review of Systems: A 12 point ROS discussed and pertinent positives are indicated in the HPI above.  All other systems are negative.  Review of Systems  Vital Signs: BP (!) 170/81 (BP Location: Left Arm, Patient Position: Sitting, Cuff Size: Normal)   Pulse 62   Temp 97.6 F (36.4 C) (Oral)   Resp 15   Ht 5\' 10"  (1.778 m)   Wt 195 lb (88.5 kg)   SpO2 97%   BMI 27.98 kg/m   Physical Exam  Constitutional: He is oriented to person,  place, and time. He appears well-developed and well-nourished. No distress.  Eyes: Conjunctivae are normal. No scleral icterus.  Cardiovascular: Normal rate and regular rhythm.   No murmur heard. Pulmonary/Chest: Effort normal and breath sounds normal. No respiratory distress. He has no wheezes.  Abdominal: Soft. Bowel sounds are normal. He exhibits no distension and no mass. There is no tenderness. There is no guarding.  Musculoskeletal: Normal range of motion. He exhibits no edema.  Neurological: He is alert and oriented to person, place, and time.  Skin: He is not diaphoretic.     Imaging: Ct Abdomen W Wo Contrast  Result Date: 03/10/2016 CLINICAL DATA:  Status post cryoablation of interpolar right renal cell carcinoma on 10/05/2012, presenting for follow-up. Remote history of prostate cancer. EXAM: CT ABDOMEN WITHOUT AND WITH CONTRAST TECHNIQUE: Multidetector CT imaging of the abdomen was performed following the standard protocol before and following the bolus administration of intravenous contrast. CONTRAST:  149mL ISOVUE-300 IOPAMIDOL (ISOVUE-300) INJECTION 61% COMPARISON:  03/05/2015 CT abdomen. FINDINGS: Lower chest: No significant pulmonary nodules or acute consolidative airspace disease. Coronary atherosclerosis. Hepatobiliary: Normal liver size. Hypodense 1.0 cm subcapsular inferior right liver lobe lesion (series 6/ image 46) is stable since at least 10/23/2012, consistent with a benign lesion. Two additional subcentimeter hypodense right liver lesions are too small to characterize, stable for at least 12 months and considered benign. No new liver lesions. Probable tiny gallstones layering in the nondistended gallbladder, with no gallbladder wall thickening or pericholecystic fluid. No biliary ductal dilatation. Pancreas: Normal, with no mass or duct dilation. Spleen: Normal size. No mass. Adrenals/Urinary Tract: No discrete adrenal nodules. At least 5 nonobstructing stones scattered in  the right kidney, largest 11 x 7 mm in the upper right kidney and 5 mm in the interpolar right kidney. At least 5 nonobstructing stones scattered in the left kidney, largest 6 mm in the upper left kidney, 7 mm in the posterior interpolar left kidney and 5 mm in the anterior lower left kidney. No hydronephrosis. Small simple parapelvic renal cysts in both kidneys. Stable scarring with no evidence of an enhancing mass at the ablation site in the posterior interpolar right kidney. There is a stable 1.0 cm hypodense renal cortical lesion in the lateral upper right kidney (series 4/image 41) without convincing enhancement, favor a benign renal cyst. Hypodense 0.6 cm renal cortical lesion in the lateral upper right kidney (series 4/image 47), too small to characterize, previously 0.8 cm, slightly decreased, favoring a benign lesion. There is a 2.8 x 1.7 cm renal cortical mass in the anterior lower right kidney (  series 4/image 62), demonstrating hyperenhancement, most consistent with a renal cell carcinoma, previously 2.8 x 1.7 cm on 03/05/2015 using similar measurement technique, stable. There is a 1.8 x 1.6 cm partially exophytic renal cortical mass in the anterior upper left kidney (series 6/image 43), demonstrating precontrast hyperdensity and mild enhancement, previously 1.7 x 1.5 cm on 03/05/2015 using similar measurement technique, minimally increased, most consistent with a renal cell carcinoma. There is a 1.8 x 1.5 cm renal cortical mass in the posterior upper left kidney (series 4/image 44) demonstrating hyperenhancement, most consistent with a renal cell carcinoma, previously 1.8 x 1.4 cm on 03/05/2015 using similar measurement technique, stable. Simple 1.3 cm renal cyst in the anterior lower left kidney. Stomach/Bowel: Small hiatal hernia. Otherwise collapsed and grossly normal stomach. Visualized small and large bowel is normal caliber, with no bowel wall thickening. Mild distal colonic diverticulosis.  Vascular/Lymphatic: Atherosclerotic nonaneurysmal abdominal aorta. Patent portal, splenic, hepatic and renal veins. No pathologically enlarged lymph nodes in the abdomen. Other: No pneumoperitoneum, ascites or focal fluid collection. Musculoskeletal: No aggressive appearing focal osseous lesions. Non expansile sclerotic lateral right ninth rib lesion is stable since at least 10/23/2012 and considered benign. Moderate thoracic spondylosis. IMPRESSION: 1. No evidence of local tumor recurrence at the ablation site in the posterior interpolar right kidney. 2. Hypoenhancing hemorrhagic/proteinaceous 1.8 cm renal cortical mass in the anterior upper left kidney, slightly increased in size, most consistent with a renal cell carcinoma. 3. Interval stability of hyperenhancing 2.8 cm lower right and 1.8 cm posterior upper left renal cortical masses, most consistent with two additional stable renal cell carcinomas. 4. No evidence of metastatic disease in the abdomen. Renal veins remain patent with no tumor thrombus. 5. Additional findings include aortic atherosclerosis, coronary atherosclerosis, cholelithiasis, small hiatal hernia and mild distal colonic diverticulosis. Electronically Signed   By: Ilona Sorrel M.D.   On: 03/10/2016 09:54    Labs:  CBC: No results for input(s): WBC, HGB, HCT, PLT in the last 8760 hours.  COAGS: No results for input(s): INR, APTT in the last 8760 hours.  BMP:  Recent Labs  03/10/16 0902  CREATININE 1.20    LIVER FUNCTION TESTS: No results for input(s): BILITOT, AST, ALT, ALKPHOS, PROT, ALBUMIN in the last 8760 hours.   Assessment and Plan:  3.5 years status post cryoablation of a 1.0 cm right renal cell carcinoma. Stable CT exam demonstrating no change at the right renal cryoablation defect. No evidence of residual or recurrent tumor at the treated site.  Stable 2.7 cm second hypoenhancing right renal interpolar mass into the hilum dating back to July 2014.  Close  follow-up and comparison of his left kidney demonstrates very minimal measurable increase in size of a 1.8 cm left anterior upper pole hypoenhancing renal mass suspicious for a low-grade renal cell carcinoma.  Also, Stable appearance of an additional posterior left upper pole cortical mass with enhancement measuring 1.8 cm.  Imaging findings were reviewed closely with the patient. Overall, the CT findings remain stable except for very minimal measurable increase in the 1.8 cm left upper pole renal mass.  After discussion, he is in agreement with continued surveillance imaging at 12 months.   Thank you for this interesting consult.  I greatly enjoyed meeting Dylan Harris and look forward to participating in their care.  A copy of this report was sent to the requesting provider on this date.  Electronically Signed: Greggory Keen 03/10/2016, 1:48 PM   I spent a total of  25 Minutes in face to face in clinical consultation, greater than 50% of which was counseling/coordinating care for this patient with renal cell carcinoma.

## 2016-03-15 ENCOUNTER — Encounter: Payer: Self-pay | Admitting: Interventional Radiology

## 2016-05-31 DIAGNOSIS — C641 Malignant neoplasm of right kidney, except renal pelvis: Secondary | ICD-10-CM | POA: Diagnosis not present

## 2016-05-31 DIAGNOSIS — M159 Polyosteoarthritis, unspecified: Secondary | ICD-10-CM | POA: Diagnosis not present

## 2016-05-31 DIAGNOSIS — F5101 Primary insomnia: Secondary | ICD-10-CM | POA: Diagnosis not present

## 2016-05-31 DIAGNOSIS — Z8546 Personal history of malignant neoplasm of prostate: Secondary | ICD-10-CM | POA: Diagnosis not present

## 2016-05-31 DIAGNOSIS — I10 Essential (primary) hypertension: Secondary | ICD-10-CM | POA: Diagnosis not present

## 2016-05-31 DIAGNOSIS — N3281 Overactive bladder: Secondary | ICD-10-CM | POA: Diagnosis not present

## 2016-05-31 DIAGNOSIS — F3342 Major depressive disorder, recurrent, in full remission: Secondary | ICD-10-CM | POA: Diagnosis not present

## 2016-05-31 DIAGNOSIS — D696 Thrombocytopenia, unspecified: Secondary | ICD-10-CM | POA: Diagnosis not present

## 2016-05-31 DIAGNOSIS — Z955 Presence of coronary angioplasty implant and graft: Secondary | ICD-10-CM | POA: Diagnosis not present

## 2016-05-31 DIAGNOSIS — E782 Mixed hyperlipidemia: Secondary | ICD-10-CM | POA: Diagnosis not present

## 2016-05-31 DIAGNOSIS — R12 Heartburn: Secondary | ICD-10-CM | POA: Diagnosis not present

## 2016-05-31 DIAGNOSIS — Z0000 Encounter for general adult medical examination without abnormal findings: Secondary | ICD-10-CM | POA: Diagnosis not present

## 2016-05-31 DIAGNOSIS — I2510 Atherosclerotic heart disease of native coronary artery without angina pectoris: Secondary | ICD-10-CM | POA: Diagnosis not present

## 2016-09-21 DIAGNOSIS — H353132 Nonexudative age-related macular degeneration, bilateral, intermediate dry stage: Secondary | ICD-10-CM | POA: Diagnosis not present

## 2016-09-21 DIAGNOSIS — H02831 Dermatochalasis of right upper eyelid: Secondary | ICD-10-CM | POA: Diagnosis not present

## 2016-09-21 DIAGNOSIS — Z83511 Family history of glaucoma: Secondary | ICD-10-CM | POA: Diagnosis not present

## 2016-09-21 DIAGNOSIS — Z961 Presence of intraocular lens: Secondary | ICD-10-CM | POA: Diagnosis not present

## 2016-09-21 DIAGNOSIS — H524 Presbyopia: Secondary | ICD-10-CM | POA: Diagnosis not present

## 2016-09-21 DIAGNOSIS — H40023 Open angle with borderline findings, high risk, bilateral: Secondary | ICD-10-CM | POA: Diagnosis not present

## 2016-09-21 DIAGNOSIS — H52203 Unspecified astigmatism, bilateral: Secondary | ICD-10-CM | POA: Diagnosis not present

## 2016-09-21 DIAGNOSIS — H02834 Dermatochalasis of left upper eyelid: Secondary | ICD-10-CM | POA: Diagnosis not present

## 2016-09-21 DIAGNOSIS — H47012 Ischemic optic neuropathy, left eye: Secondary | ICD-10-CM | POA: Diagnosis not present

## 2016-09-21 DIAGNOSIS — D3131 Benign neoplasm of right choroid: Secondary | ICD-10-CM | POA: Diagnosis not present

## 2016-09-21 DIAGNOSIS — H5203 Hypermetropia, bilateral: Secondary | ICD-10-CM | POA: Diagnosis not present

## 2017-01-25 DIAGNOSIS — Z23 Encounter for immunization: Secondary | ICD-10-CM | POA: Diagnosis not present

## 2017-02-01 DIAGNOSIS — H353132 Nonexudative age-related macular degeneration, bilateral, intermediate dry stage: Secondary | ICD-10-CM | POA: Diagnosis not present

## 2017-02-01 DIAGNOSIS — H52203 Unspecified astigmatism, bilateral: Secondary | ICD-10-CM | POA: Diagnosis not present

## 2017-02-01 DIAGNOSIS — H524 Presbyopia: Secondary | ICD-10-CM | POA: Diagnosis not present

## 2017-02-09 DIAGNOSIS — C44319 Basal cell carcinoma of skin of other parts of face: Secondary | ICD-10-CM | POA: Diagnosis not present

## 2017-02-09 DIAGNOSIS — L570 Actinic keratosis: Secondary | ICD-10-CM | POA: Diagnosis not present

## 2017-02-09 DIAGNOSIS — L821 Other seborrheic keratosis: Secondary | ICD-10-CM | POA: Diagnosis not present

## 2017-02-27 ENCOUNTER — Other Ambulatory Visit: Payer: Self-pay | Admitting: *Deleted

## 2017-02-27 ENCOUNTER — Other Ambulatory Visit (HOSPITAL_COMMUNITY): Payer: Self-pay | Admitting: Interventional Radiology

## 2017-02-27 DIAGNOSIS — N2889 Other specified disorders of kidney and ureter: Secondary | ICD-10-CM

## 2017-02-28 ENCOUNTER — Other Ambulatory Visit: Payer: Self-pay | Admitting: *Deleted

## 2017-02-28 DIAGNOSIS — N2889 Other specified disorders of kidney and ureter: Secondary | ICD-10-CM

## 2017-03-09 DIAGNOSIS — C44319 Basal cell carcinoma of skin of other parts of face: Secondary | ICD-10-CM | POA: Diagnosis not present

## 2017-03-21 ENCOUNTER — Encounter (HOSPITAL_COMMUNITY): Payer: Self-pay

## 2017-03-21 ENCOUNTER — Ambulatory Visit
Admission: RE | Admit: 2017-03-21 | Discharge: 2017-03-21 | Disposition: A | Discharge status: Home / Self Care | Payer: PPO | Source: Ambulatory Visit | Attending: Interventional Radiology | Admitting: Interventional Radiology

## 2017-03-21 ENCOUNTER — Ambulatory Visit (HOSPITAL_COMMUNITY)
Admission: RE | Admit: 2017-03-21 | Discharge: 2017-03-21 | Disposition: A | Discharge status: Home / Self Care | Payer: PPO | Source: Ambulatory Visit | Attending: Interventional Radiology | Admitting: Interventional Radiology

## 2017-03-21 DIAGNOSIS — I700 Atherosclerosis of aorta: Secondary | ICD-10-CM | POA: Diagnosis not present

## 2017-03-21 DIAGNOSIS — Z9289 Personal history of other medical treatment: Secondary | ICD-10-CM | POA: Diagnosis not present

## 2017-03-21 DIAGNOSIS — N2889 Other specified disorders of kidney and ureter: Secondary | ICD-10-CM

## 2017-03-21 DIAGNOSIS — N200 Calculus of kidney: Secondary | ICD-10-CM | POA: Insufficient documentation

## 2017-03-21 DIAGNOSIS — I2510 Atherosclerotic heart disease of native coronary artery without angina pectoris: Secondary | ICD-10-CM | POA: Insufficient documentation

## 2017-03-21 DIAGNOSIS — K449 Diaphragmatic hernia without obstruction or gangrene: Secondary | ICD-10-CM | POA: Diagnosis not present

## 2017-03-21 DIAGNOSIS — C641 Malignant neoplasm of right kidney, except renal pelvis: Secondary | ICD-10-CM | POA: Diagnosis not present

## 2017-03-21 HISTORY — PX: IR RADIOLOGIST EVAL & MGMT: IMG5224

## 2017-03-21 LAB — POCT I-STAT CREATININE: Creatinine, Ser: 1.1 mg/dL (ref 0.61–1.24)

## 2017-03-21 MED ORDER — IOPAMIDOL (ISOVUE-300) INJECTION 61%
100.0000 mL | Freq: Once | INTRAVENOUS | Status: AC | PRN
  Administered 2017-03-21: 100 mL via INTRAVENOUS

## 2017-03-21 MED ORDER — IOPAMIDOL (ISOVUE-300) INJECTION 61%
INTRAVENOUS | Status: AC
  Filled 2017-03-21: qty 100

## 2017-03-21 NOTE — Progress Notes (Signed)
Patient ID: Dylan Harris, male   DOB: 05-05-1933, 83 y.o.   MRN: [id number]       Chief Complaint: 4 year status post right renal cell cryoablation. Outpatient follow-up.  Referring Physician(s): Doneisha Ivey  History of Present Illness: Dylan Harris is a 83 y.o. male who is now 4 years status post cryoablation of a 1.9 cm right renal cell carcinoma. He returns for annual outpatient follow-up. He continues to do very well. No significant abdominal or flank pain. No fevers, hematuria or dysuria. No change in urinary habits or bowel habits. He remains asymptomatic. Stable weight and appetite. He is followed closely by Dr. Felipa Eth at [facility]. He returns to review his annual surveillance imaging for bilateral renal lesions.  Past Medical History:  Diagnosis Date  . Arthritis   . Cancer [facility]) 1990   prostate  . Chronic kidney disease    many times  . Complication of anesthesia    general anesthesia- severe vomiting, woke up during surgery vomiting and for 13 hours after  . Coronary artery disease    2 stents  . Depression    mild, on Zoloft  . GERD (gastroesophageal reflux disease)   . H/O hiatal hernia   . History of kidney stones   . Hypercholesteremia   . Hypertension   . PONV (postoperative nausea and vomiting)     Past Surgical History:  Procedure Laterality Date  . CARDIAC CATHETERIZATION    . CORONARY ANGIOPLASTY  2007   2 stents placed  . esophagus stretch  2014  . EYE SURGERY Bilateral    cataract  . IR GENERIC HISTORICAL  03/10/2016   IR RADIOLOGIST EVAL & MGMT 03/10/2016 Greggory Keen, MD [facility] INTERV RAD  . IR RADIOLOGIST EVAL & MGMT  03/21/2017  . KNEE ARTHROPLASTY  2009   medial half of right knee replaced  . LITHOTRIPSY    . PROSTATECTOMY  1990  . TONSILLECTOMY      Allergies: Patient has no known allergies.  Medications: Prior to Admission medications   Medication Sig Start Date End Date Taking? Authorizing Provider  amLODipine (NORVASC) 10  MG tablet Take 10 mg by mouth every morning.    Yes [provider]  ASPIRIN 81 PO Take 81 mg by mouth daily.   Yes [provider]  hydrochlorothiazide (HYDRODIURIL) 25 MG tablet Take 25 mg by mouth every morning.    Yes [provider]  losartan (COZAAR) 100 MG tablet Take 100 mg by mouth every morning.    Yes [provider]  Multiple Vitamin (MULTIVITAMIN) tablet Take 1 tablet by mouth daily.   Yes [provider]  Multiple Vitamins-Minerals (ICAPS) CAPS Take 1 capsule by mouth 2 (two) times daily.   Yes [provider]  potassium chloride (K-DUR) 10 MEQ tablet Take 10 mEq by mouth 2 (two) times daily.   Yes [provider]  sertraline (ZOLOFT) 100 MG tablet Take 100 mg by mouth every morning.    Yes [provider]  simvastatin (ZOCOR) 40 MG tablet Take 40 mg by mouth every evening.   Yes [provider]  zolpidem (AMBIEN) 10 MG tablet Take 10 mg by mouth at bedtime as needed for sleep. Patient takes 1/2 tablet   Yes [provider]  aspirin 325 MG tablet Take 325 mg by mouth every evening.     [provider]  Nebivolol HCl (BYSTOLIC) 20 MG TABS Take 20 mg by mouth every morning.    [provider]  niacin (NIASPAN) 500 MG CR tablet Take 1,000 mg by mouth every evening.     [provider]     No family history on file.  Social History   Socioeconomic History  . Marital status: Married    Spouse name: Not on file  . Number of children: Not on file  . Years of education: Not on file  . Highest education level: Not on file  Social Needs  . Financial resource strain: Not on file  . Food insecurity - worry: Not on file  . Food insecurity - inability: Not on file  . Transportation needs - medical: Not on file  . Transportation needs - non-medical: Not on file  Occupational History  . Not on file  Tobacco Use  . Smoking status: Never Smoker  . Smokeless tobacco:  Never Used  Substance and Sexual Activity  . Alcohol use: No  . Drug use: No  . Sexual activity: Not on file  Other Topics Concern  . Not on file  Social History Narrative  . Not on file      Review of Systems: A 12 point ROS discussed and pertinent positives are indicated in the HPI above.  All other systems are negative.  Review of Systems  Vital Signs: BP (!) 163/84   Pulse 74   Temp 97.8 F (36.6 C) (Oral)   Resp 14   Ht 5\' 10"  (1.778 m)   Wt 195 lb (88.5 kg)   SpO2 98%   BMI 27.98 kg/m   Physical Exam  Constitutional: He is oriented to person, place, and time. He appears well-developed and well-nourished. No distress.  Eyes: Conjunctivae are normal. No scleral icterus.  Cardiovascular: Normal rate, regular rhythm and normal heart sounds.  Pulmonary/Chest: Effort normal and breath sounds normal.  Abdominal: Soft. Bowel sounds are normal. He exhibits no mass. There is no tenderness.  Musculoskeletal: Normal range of motion. He exhibits no edema.  Neurological: He is alert and oriented to person, place, and time.  Skin: Skin is warm and dry. He is not diaphoretic. No erythema.  Psychiatric: He has a normal mood and affect.      Imaging: Ct Abdomen W Wo Contrast  Result Date: 03/21/2017 CLINICAL DATA:  Right-sided renal cell carcinoma diagnosed in 2014 with cryoablation. Prostate cancer in 1990 with prostatectomy. Bilateral renal masses. EXAM: CT ABDOMEN WITHOUT AND WITH CONTRAST TECHNIQUE: Multidetector CT imaging of the abdomen was performed following the standard protocol before and following the bolus administration of intravenous contrast. CONTRAST:  100 cc of Isovue-300 COMPARISON:  03/10/2016 FINDINGS: Lower chest: Clear lung bases. Mild cardiomegaly. Right coronary artery atherosclerosis. Hepatobiliary: 8 mm subcapsular right hepatic lobe low-density lesion image 46/series 4 is similar to on the prior exam, favoring a benign etiology. There is also a high right  hepatic lobe tiny cyst. Normal gallbladder, without biliary ductal dilatation. Pancreas: Normal, without mass or ductal dilatation. Spleen: Splenule. Adrenals/Urinary Tract: Bilateral adrenal nodularity is unchanged. Mild renal cortical thinning bilaterally. Bilateral multiple renal collecting system calculi. No hydronephrosis. No ureteric stones. Ablation defect within the posterolateral interpolar right kidney is unchanged. Bilateral renal sinus cysts. A lateral upper pole right renal lesion measures 1.2 cm and is most consistent with a minimally complex cyst. Anterior interpolar left renal cyst. Multiple enhancing renal lesions as detailed : -Central anterior upper pole right renal lesion measures 1.4 cm on image 47/series 7, similar in size to on the prior exam. This demonstrates arterial hyper enhancement and  delayed hypoenhancement. -Lateral interpolar right renal lesion measures 1.6 cm on image 52/series 7 versus 1.5 cm on the prior exam (when remeasured). This is also early post-contrast hyperenhancing and delayed hypoenhancing. -Central interpolar right renal lesion measures 2.1 cm on image 65/series 7 versus similar to on the prior exam (when remeasured). This demonstrates similar postcontrast characteristics, consistent with renal cell carcinoma. -Cortically based posteromedial 1.3 cm lower pole right renal lesion is similar in size to on the prior exam (when remeasured). This alsoalso early hyperenhancing and delayed hypoenhancing. -Partially exophytic upper pole left renal lesion laterally measures 1.6 cm on image 48/series 7. This is hyperattenuating prior to contrast, but also hyperenhancing on image 46/series 4. -Posterior upper/interpolar left renal lesion measures 1.7 cm on image 49/series 7 and is similar in size to on the prior. Early post-contrast hyperenhancing and delayed hypoenhancing. Stomach/Bowel: Tiny hiatal hernia. Gastric underdistention. Apparent wall thickening at least partially felt  to be secondary. Scattered colonic diverticula. Normal abdominal small bowel. Vascular/Lymphatic: Aortic and branch vessel atherosclerosis. Patent renal veins. No retroperitoneal or retrocrural adenopathy. Other: No ascites. Musculoskeletal: No acute osseous abnormality. Lower lumbar spondylosis and degenerative disc disease. IMPRESSION: 1. Similar right-sided renal ablation defect, without evidence of metastatic disease. 2. Bilateral enhancing renal lesions (4 on the right and 2 on the left), consistent with renal cell carcinomas. These are similar in size, without dominant or significantly enlarging lesion. 3. Coronary artery atherosclerosis. Aortic Atherosclerosis (ICD10-I70.0). 4. Tiny hiatal hernia. Gastric underdistention. Cannot exclude concurrent gastric wall thickening. Correlate with any symptoms of gastritis. 5. Bilateral nephrolithiasis. Electronically Signed   By: Abigail Miyamoto M.D.   On: 03/21/2017 08:30   Ir Radiologist Eval & Mgmt  Result Date: 03/21/2017 Please refer to notes tab for details about interventional procedure. (Op Note)   Labs:  CBC: No results for input(s): WBC, HGB, HCT, PLT in the last 8760 hours.  COAGS: No results for input(s): INR, APTT in the last 8760 hours.  BMP: Recent Labs    03/21/17 0728  CREATININE 1.10    LIVER FUNCTION TESTS: No results for input(s): BILITOT, AST, ALT, ALKPHOS, PROT, ALBUMIN in the last 8760 hours.  TUMOR MARKERS: No results for input(s): AFPTM, CEA, CA199, CHROMGRNA in the last 8760 hours.  Assessment and Plan:  4 years status post right renal cell carcinoma cryoablation. Surveillance CT imaging confirms no significant change in the right renal cryoablation defect. No evidence of residual or recurrent tumor at the treated site.   He has additional solid enhancing renal masses bilaterally (4 on the right and 2 on the left. This continues to be stable in size and imaging enhancement characteristics compared to several prior  studies.  Imaging findings were reviewed closely with the patient. No new finding. Recommend continue surveillance at 12 months.  Plan: Repeat surveillance CT in 12 months.    Electronically Signed: Greggory Keen 03/21/2017, 11:05 AM   I spent a total of    25 Minutes in face to face in clinical consultation, greater than 50% of which was counseling/coordinating care for this patient with renal cell carcinoma.

## 2018-02-12 ENCOUNTER — Other Ambulatory Visit: Payer: Self-pay | Admitting: *Deleted

## 2018-02-12 ENCOUNTER — Other Ambulatory Visit (HOSPITAL_COMMUNITY): Payer: Self-pay | Admitting: Interventional Radiology

## 2018-02-12 DIAGNOSIS — C641 Malignant neoplasm of right kidney, except renal pelvis: Secondary | ICD-10-CM

## 2018-03-07 ENCOUNTER — Ambulatory Visit
Admission: RE | Admit: 2018-03-07 | Discharge: 2018-03-07 | Disposition: A | Discharge status: Home / Self Care | Payer: PPO | Source: Ambulatory Visit | Attending: Interventional Radiology | Admitting: Interventional Radiology

## 2018-03-07 ENCOUNTER — Ambulatory Visit (HOSPITAL_COMMUNITY)
Admission: RE | Admit: 2018-03-07 | Discharge: 2018-03-07 | Disposition: A | Discharge status: Home / Self Care | Payer: Medicare Other | Source: Ambulatory Visit | Attending: Interventional Radiology | Admitting: Interventional Radiology

## 2018-03-07 ENCOUNTER — Encounter (HOSPITAL_COMMUNITY): Payer: Self-pay

## 2018-03-07 DIAGNOSIS — N200 Calculus of kidney: Secondary | ICD-10-CM | POA: Diagnosis not present

## 2018-03-07 DIAGNOSIS — C641 Malignant neoplasm of right kidney, except renal pelvis: Secondary | ICD-10-CM | POA: Diagnosis present

## 2018-03-07 HISTORY — PX: IR RADIOLOGIST EVAL & MGMT: IMG5224

## 2018-03-07 LAB — POCT I-STAT CREATININE: CREATININE: 1.1 mg/dL (ref 0.61–1.24)

## 2018-03-07 MED ORDER — IOHEXOL 300 MG/ML  SOLN
100.0000 mL | Freq: Once | INTRAMUSCULAR | Status: AC | PRN
  Administered 2018-03-07: 100 mL via INTRAVENOUS

## 2018-03-07 MED ORDER — SODIUM CHLORIDE (PF) 0.9 % IJ SOLN
INTRAMUSCULAR | Status: AC
  Filled 2018-03-07: qty 50

## 2018-03-07 NOTE — Progress Notes (Signed)
Patient ID: Dylan Harris, male   DOB: Jul 03, 1933, 84 y.o.   MRN: [id number]       Chief Complaint:  5-year status post right renal cell carcinoma cryoablation  Referring Physician(s): [place]  History of Present Illness: Dylan Harris is a 84 y.o. male who is now approximately 5 years status post cryoablation of a 1.9 cm right renal cell carcinoma.  He returns for annual outpatient follow-up.  He continues to do very well and remains asymptomatic.  No significant flank or abdominal pain.  No fevers, chills, hematuria, dysuria, or significant change in urinary habits.  Stable weight and appetite.  He returns for annual surveillance imaging and review.  He is also closely followed by Dr. Felipa Eth at [facility].  Past Medical History:  Diagnosis Date  . Arthritis   . Cancer [facility]) 1990   prostate  . Chronic kidney disease    many times  . Complication of anesthesia    general anesthesia- severe vomiting, woke up during surgery vomiting and for 13 hours after  . Coronary artery disease    2 stents  . Depression    mild, on Zoloft  . GERD (gastroesophageal reflux disease)   . H/O hiatal hernia   . History of kidney stones   . Hypercholesteremia   . Hypertension   . PONV (postoperative nausea and vomiting)     Past Surgical History:  Procedure Laterality Date  . CARDIAC CATHETERIZATION    . CORONARY ANGIOPLASTY  2007   2 stents placed  . esophagus stretch  2014  . EYE SURGERY Bilateral    cataract  . IR GENERIC HISTORICAL  03/10/2016   IR RADIOLOGIST EVAL & MGMT 03/10/2016 Greggory Keen, MD [facility] INTERV RAD  . IR RADIOLOGIST EVAL & MGMT  03/21/2017  . IR RADIOLOGIST EVAL & MGMT  03/07/2018  . KNEE ARTHROPLASTY  2009   medial half of right knee replaced  . LITHOTRIPSY    . PROSTATECTOMY  1990  . TONSILLECTOMY      Allergies: Patient has no known allergies.  Medications: Prior to Admission medications   Medication Sig Start Date End Date Taking? Authorizing  Provider  amLODipine (NORVASC) 10 MG tablet Take 10 mg by mouth every morning.    Yes [provider]  ASPIRIN 81 PO Take 81 mg by mouth daily.   Yes [provider]  hydrochlorothiazide (HYDRODIURIL) 25 MG tablet Take 25 mg by mouth every morning.    Yes [provider]  losartan (COZAAR) 100 MG tablet Take 100 mg by mouth every morning.    Yes [provider]  Multiple Vitamin (MULTIVITAMIN) tablet Take 1 tablet by mouth daily.   Yes [provider]  Multiple Vitamins-Minerals (ICAPS) CAPS Take 1 capsule by mouth 2 (two) times daily.   Yes [provider]  potassium chloride (K-DUR) 10 MEQ tablet Take 10 mEq by mouth 2 (two) times daily.   Yes [provider]  sertraline (ZOLOFT) 100 MG tablet Take 100 mg by mouth every morning.    Yes [provider]  simvastatin (ZOCOR) 40 MG tablet Take 40 mg by mouth every evening.   Yes [provider]     No family history on file.  Social History   Socioeconomic History  . Marital status: Married    Spouse name: Not on file  . Number of children: Not on file  . Years of education: Not on file  . Highest education level: Not on file  Occupational History  . Not on file  Social Needs  . Financial resource strain: Not on file  . Food insecurity:    Worry: Not on file    Inability: Not on file  . Transportation needs:    Medical: Not on file    Non-medical: Not on file  Tobacco Use  . Smoking status: Never Smoker  . Smokeless tobacco: Never Used  Substance and Sexual Activity  . Alcohol use: No  . Drug use: No  . Sexual activity: Not on file  Lifestyle  . Physical activity:    Days per week: Not on file    Minutes per session: Not on file  . Stress: Not on file  Relationships  . Social connections:    Talks on phone: Not on file    Gets together: Not on file    Attends religious service: Not on file    Active member of club or organization: Not on  file    Attends meetings of clubs or organizations: Not on file    Relationship status: Not on file  Other Topics Concern  . Not on file  Social History Narrative  . Not on file    ECOG Status: 0 - Asymptomatic  Review of Systems: A 12 point ROS discussed and pertinent positives are indicated in the HPI above.  All other systems are negative.  Review of Systems  Vital Signs: BP (!) 151/87   Pulse 67   Temp 97.7 F (36.5 C) (Oral)   Resp 16   Ht 5\' 10"  (1.778 m)   Wt 83.9 kg   SpO2 97%   BMI 26.54 kg/m   Physical Exam  Constitutional: He is oriented to person, place, and time. He appears well-developed and well-nourished. No distress.  Eyes: Conjunctivae are normal. No scleral icterus.  Cardiovascular: Normal rate and regular rhythm.  Pulmonary/Chest: Effort normal and breath sounds normal.  Abdominal: Soft. Bowel sounds are normal. He exhibits no distension. There is no tenderness.  Musculoskeletal: Normal range of motion. He exhibits no edema.  Neurological: He is alert and oriented to person, place, and time.  Skin: Skin is dry. No rash noted. He is not diaphoretic.     Imaging: Ct Abdomen W Wo Contrast  Result Date: 03/07/2018 CLINICAL DATA:  Renal cell cancer 2014, status post cryoablation. Also with history of prostate cancer. EXAM: CT ABDOMEN WITHOUT AND WITH CONTRAST TECHNIQUE: Multidetector CT imaging of the abdomen was performed following the standard protocol before and following the bolus administration of intravenous contrast. CONTRAST:  141mL OMNIPAQUE IOHEXOL 300 MG/ML  SOLN COMPARISON:  03/21/2017 FINDINGS: Lower chest: Coronary artery calcification is evident. Hepatobiliary: Tiny hypoattenuating foci in the liver parenchyma are unchanged. There is no evidence for gallstones, gallbladder wall thickening, or pericholecystic fluid. No intrahepatic or extrahepatic biliary dilation. Pancreas: No focal mass lesion. No dilatation of the main duct. No  intraparenchymal cyst. No peripancreatic edema. Spleen: No splenomegaly. No focal mass lesion. Adrenals/Urinary Tract: Similar appearance of the tiny bilateral adrenal nodules. Nonobstructing stones are noted in both kidneys without hydronephrosis. Right kidney: -Stable posterior interpolar ablation defect. Nonobstructing stones. Central sinus cysts. -central anterior upper pole renal lesion measured previously at 1.4 cm has increased minimally in the interval measuring 1.6 cm today (46/11). -lateral interpolar lesion measured previously at 16 mm now measures 15 mm (52/11), stable. -central interpolar lesion measured previously at 21 mm now measures 20 mm (64/11), stable. -cortically based right lower pole lesion measured previously at 13 mm  is 14 mm today (65/11), stable. Left kidney: -nonobstructing stones.  Central sinus cysts. -exophytic upper pole left renal lesion measured previously at 16 mm is stable at 16 mm today (48/11). -posterior upper interpolar left renal lesion measured previously at 17 mm is 19 mm today (50/11) minimally increased. Stomach/Bowel: Tiny hiatal hernia. Stomach otherwise unremarkable. No small bowel or colonic dilatation. Colonic diverticuli evident. Vascular/Lymphatic: There is abdominal aortic atherosclerosis without aneurysm. There is no gastrohepatic or hepatoduodenal ligament lymphadenopathy. No intraperitoneal or retroperitoneal lymphadenopathy. Other: No intraperitoneal free fluid. Musculoskeletal: No worrisome lytic or sclerotic osseous abnormality. IMPRESSION: 1. Stable appearance right-sided ablation defect. No evidence for local recurrence or metastatic disease. 2. Similar appearance of multiple bilateral enhancing renal lesions compatible with neoplasm. 3. Bilateral nonobstructing renal stones. 4.  Aortic Atherosclerois (ICD10-170.0) Electronically Signed   By: Misty Stanley M.D.   On: 03/07/2018 08:26   Ir Radiologist Eval & Mgmt  Result Date: 03/07/2018 Please refer  to notes tab for details about interventional procedure. (Op Note)   Labs:  CBC: No results for input(s): WBC, HGB, HCT, PLT in the last 8760 hours.  COAGS: No results for input(s): INR, APTT in the last 8760 hours.  BMP: Recent Labs    03/21/17 0728 03/07/18 0738  CREATININE 1.10 1.10    LIVER FUNCTION TESTS: No results for input(s): BILITOT, AST, ALT, ALKPHOS, PROT, ALBUMIN in the last 8760 hours.  TUMOR MARKERS: No results for input(s): AFPTM, CEA, CA199, CHROMGRNA in the last 8760 hours.  Assessment and Plan:  Approximately 5 years status post cryoablation of a 1.9 cm right renal cell carcinoma.  Stable CT exam demonstrating no change in the right renal ablation defect.  No evidence of residual or recurrent tumor at the treated site.  Stable creatinine at 1.1.  Surveillance imaging today demonstrates stability of additional bilateral enhancing renal lesions without significant interval growth or change.  Plan: Continue annual surveillance at 12 months.  If any of the bilateral renal masses show significant interval change consider ablation at that time.    Electronically Signed: Greggory Keen 03/07/2018, 10:44 AM   I spent a total of    25 Minutes in face to face in clinical consultation, greater than 50% of which was counseling/coordinating care for this patient with renal cell carcinoma.

## 2018-12-05 IMAGING — CT CT ABDOMEN WO/W CM
3 of 12 series · 9 of 46 positions shown, 15 images · IV contrast (agent unspecified)
Comparison: 03/10/2016

CLINICAL DATA: Right-sided renal cell carcinoma diagnosed in 9792
with cryoablation. Prostate cancer in 5663 with prostatectomy.
Bilateral renal masses.

EXAM:
CT ABDOMEN WITHOUT AND WITH CONTRAST
TECHNIQUE: Multidetector CT imaging of the abdomen was performed following the
standard protocol before and following the bolus administration of
intravenous contrast.
CONTRAST:  100 cc of Usovue-IYY

[Series 2: axial pre · axial · non-contrast · 0.87mm/px · z∈[+1253,+1406]mm · 5 of 77 slices shown, 10 images]
[im 13/77  soft-tissue]
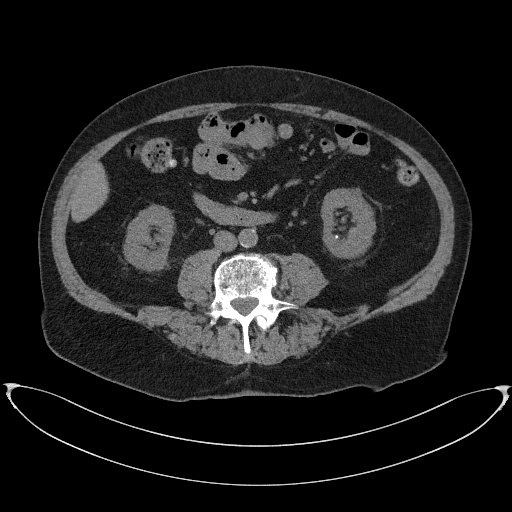
[im 13/77  bone]
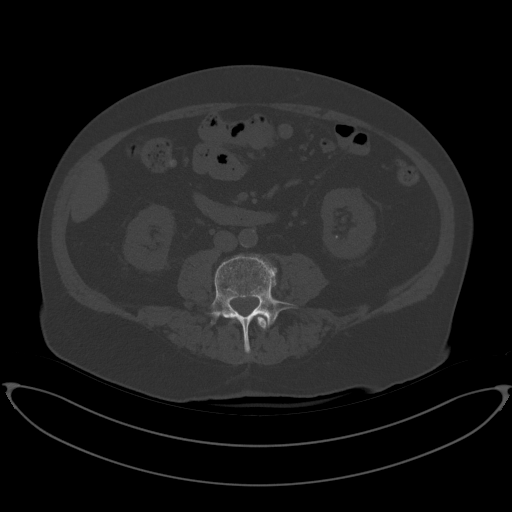
[im 26/77  soft-tissue]
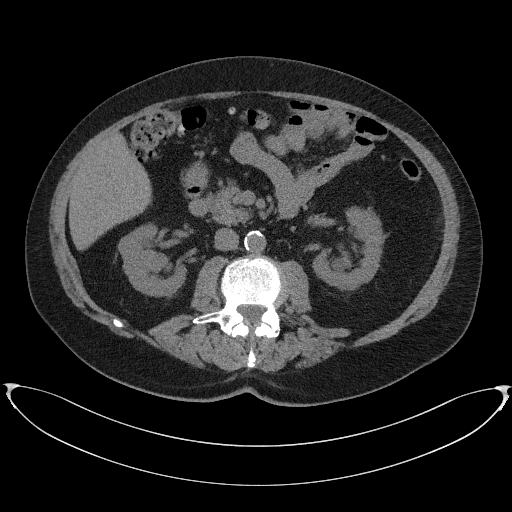
[im 26/77  lung]
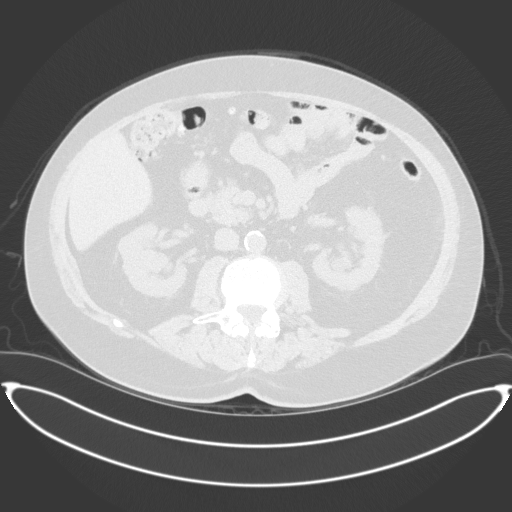
[im 39/77  soft-tissue]
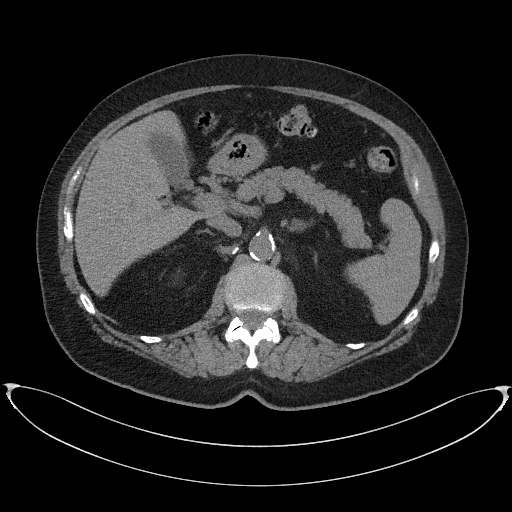
[im 39/77  lung]
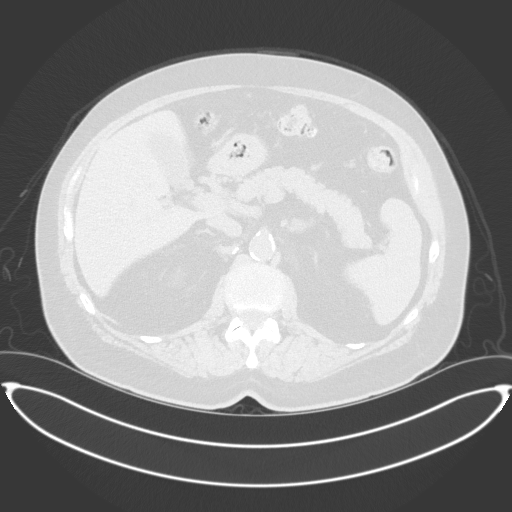
[im 51/77  soft-tissue]
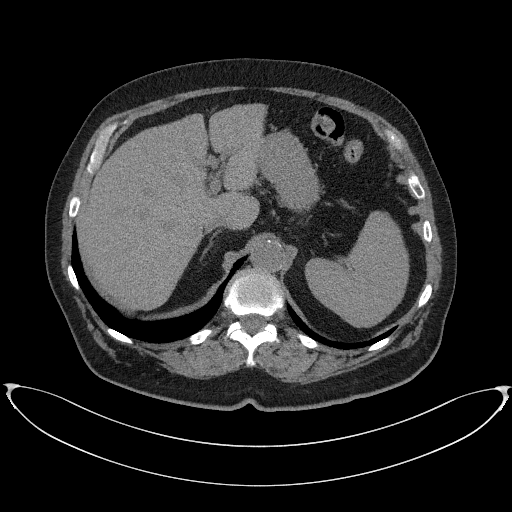
[im 51/77  lung]
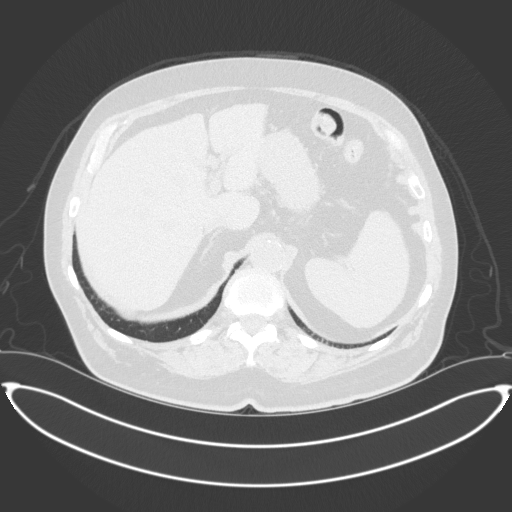
[im 64/77  soft-tissue]
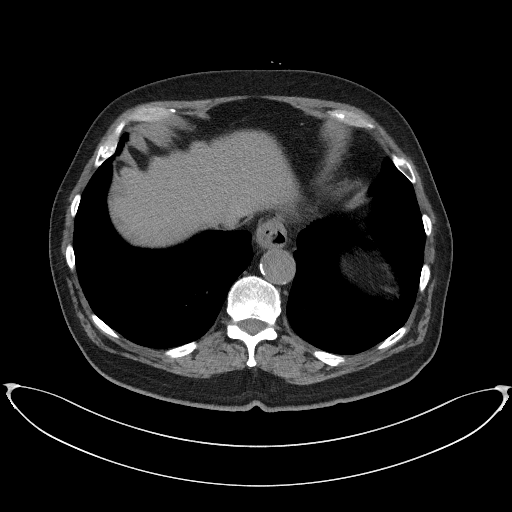
[im 64/77  lung]
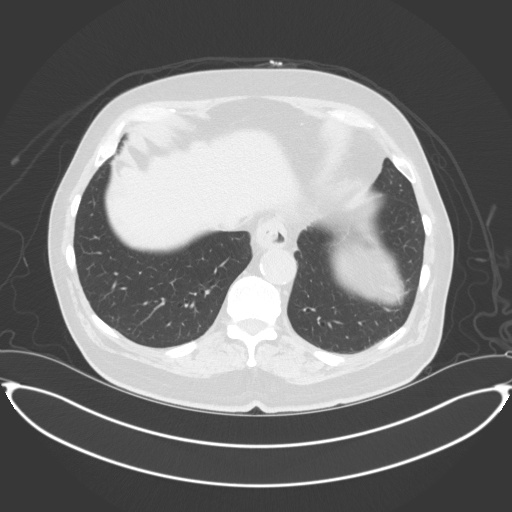

[Series 4: axial arterial · axial · arterial · 0.87mm/px · z∈[+1262,+1308]mm · 2 of 77 slices shown]
[im 16/77  soft-tissue]
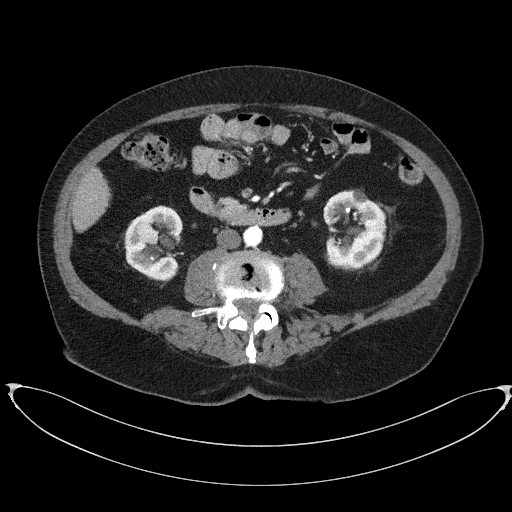
[im 31/77  soft-tissue]
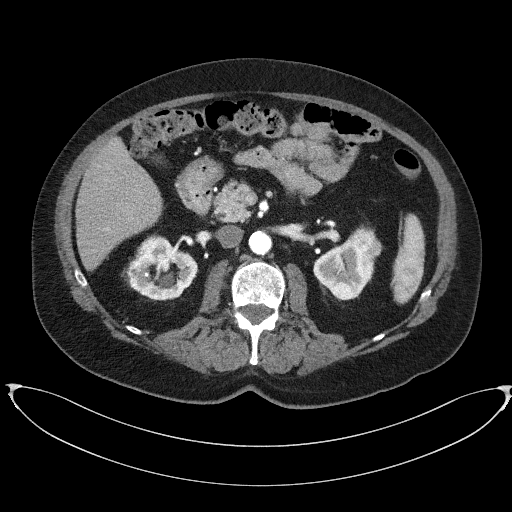

[Series 10: coronal pre · coronal · non-contrast · 0.45mm/px · 2 of 118 slices shown, 3 images]
[im 40/118  soft-tissue]
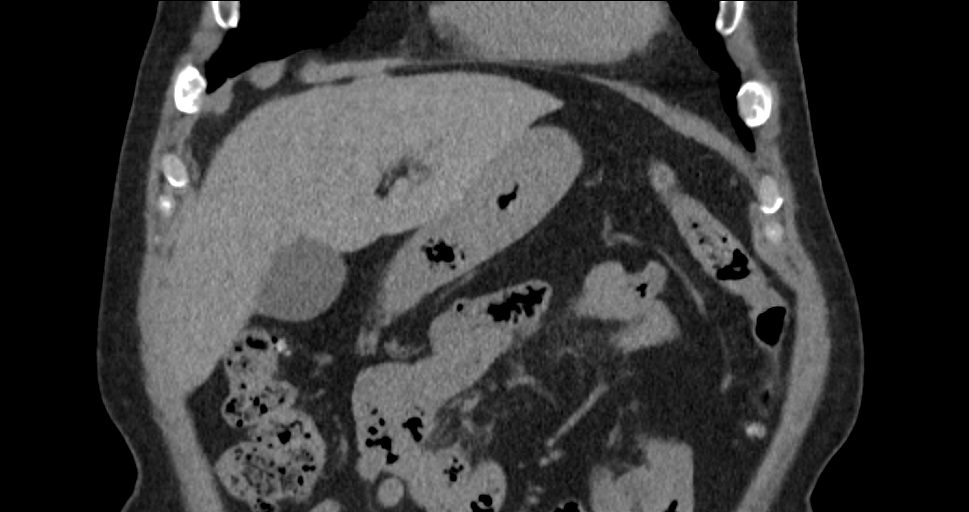
[im 40/118  bone]
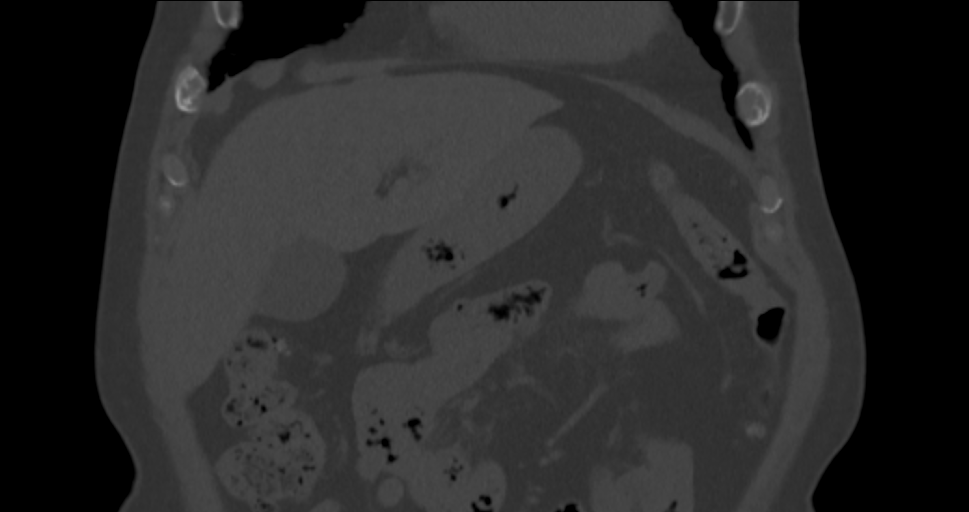
[im 79/118  soft-tissue]
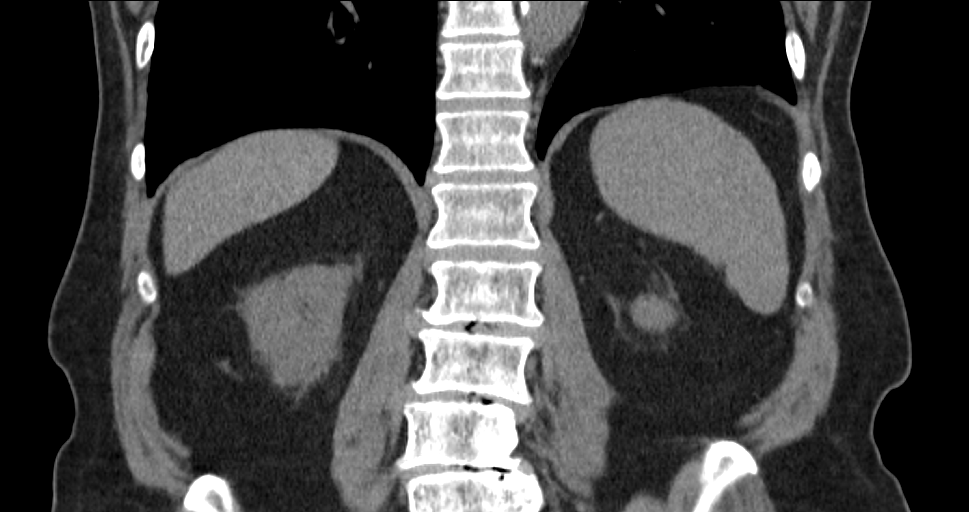

[9 of 46 positions shown; findings below may reference images not displayed]

FINDINGS: Lower chest: Clear lung bases. Mild cardiomegaly. Right coronary
artery atherosclerosis.

Hepatobiliary: 8 mm subcapsular right hepatic lobe low-density
lesion image 46/series 4 is similar to on the prior exam, favoring a
benign etiology. There is also a high right hepatic lobe tiny cyst.
Normal gallbladder, without biliary ductal dilatation.

Pancreas: Normal, without mass or ductal dilatation.

Spleen: Splenule.

Adrenals/Urinary Tract: Bilateral adrenal nodularity is unchanged.
Mild renal cortical thinning bilaterally. Bilateral multiple renal
collecting system calculi. No hydronephrosis. No ureteric stones.
Ablation defect within the posterolateral interpolar right kidney is
unchanged.

Bilateral renal sinus cysts.

A lateral upper pole right renal lesion measures 1.2 cm and is most
consistent with a minimally complex cyst. Anterior interpolar left
renal cyst.

Multiple enhancing renal lesions as detailed :

-Central anterior upper pole right renal lesion measures 1.4 cm on
image 47/series 7, similar in size to on the prior exam. This
demonstrates arterial hyper enhancement and delayed hypoenhancement.

-Lateral interpolar right renal lesion measures 1.6 cm on image
52/series 7 versus 1.5 cm on the prior exam (when remeasured). This
is also early post-contrast hyperenhancing and delayed
hypoenhancing.

-Central interpolar right renal lesion measures 2.1 cm on image
65/series 7 versus similar to on the prior exam (when remeasured).
This demonstrates similar postcontrast characteristics, consistent
with renal cell carcinoma.

-Cortically based posteromedial 1.3 cm lower pole right renal lesion
is similar in size to on the prior exam (when remeasured). This
alsoalso early hyperenhancing and delayed hypoenhancing.

-Partially exophytic upper pole left renal lesion laterally measures
1.6 cm on image 48/series 7. This is hyperattenuating prior to
contrast, but also hyperenhancing on image 46/series 4.

-Posterior upper/interpolar left renal lesion measures 1.7 cm on
image 49/series 7 and is similar in size to on the prior. Early
post-contrast hyperenhancing and delayed hypoenhancing.

Stomach/Bowel: Tiny hiatal hernia. Gastric underdistention. Apparent
wall thickening at least partially felt to be secondary. Scattered
colonic diverticula. Normal abdominal small bowel.

Vascular/Lymphatic: Aortic and branch vessel atherosclerosis. Patent
renal veins. No retroperitoneal or retrocrural adenopathy.

Other: No ascites.

Musculoskeletal: No acute osseous abnormality. Lower lumbar
spondylosis and degenerative disc disease.
IMPRESSION: 1. Similar right-sided renal ablation defect, without evidence of
metastatic disease.
2. Bilateral enhancing renal lesions (4 on the right and 2 on the
left), consistent with renal cell carcinomas. These are similar in
size, without dominant or significantly enlarging lesion.
3. Coronary artery atherosclerosis. Aortic Atherosclerosis
([id number]).
4. Tiny hiatal hernia. Gastric underdistention. Cannot exclude
concurrent gastric wall thickening. Correlate with any symptoms of
gastritis.
5. Bilateral nephrolithiasis.

## 2019-02-19 ENCOUNTER — Other Ambulatory Visit: Payer: Self-pay | Admitting: Interventional Radiology

## 2019-02-19 ENCOUNTER — Other Ambulatory Visit: Payer: Self-pay

## 2019-02-19 DIAGNOSIS — C641 Malignant neoplasm of right kidney, except renal pelvis: Secondary | ICD-10-CM

## 2019-02-19 DIAGNOSIS — N2889 Other specified disorders of kidney and ureter: Secondary | ICD-10-CM

## 2019-02-28 ENCOUNTER — Ambulatory Visit (HOSPITAL_COMMUNITY): Admission: RE | Admit: 2019-02-28 | Payer: Medicare Other | Source: Ambulatory Visit

## 2019-03-06 ENCOUNTER — Telehealth: Payer: Medicare Other

## 2019-03-18 ENCOUNTER — Ambulatory Visit (HOSPITAL_COMMUNITY)
Admission: RE | Admit: 2019-03-18 | Discharge: 2019-03-18 | Disposition: A | Discharge status: Home / Self Care | Payer: Medicare Other | Source: Ambulatory Visit | Attending: Interventional Radiology | Admitting: Interventional Radiology

## 2019-03-18 ENCOUNTER — Encounter (HOSPITAL_COMMUNITY): Payer: Self-pay

## 2019-03-18 ENCOUNTER — Other Ambulatory Visit: Payer: Self-pay

## 2019-03-18 DIAGNOSIS — C641 Malignant neoplasm of right kidney, except renal pelvis: Secondary | ICD-10-CM | POA: Diagnosis not present

## 2019-03-18 LAB — POCT I-STAT CREATININE: Creatinine, Ser: 1.4 mg/dL — ABNORMAL HIGH (ref 0.61–1.24)

## 2019-03-18 MED ORDER — SODIUM CHLORIDE (PF) 0.9 % IJ SOLN
INTRAMUSCULAR | Status: AC
  Filled 2019-03-18: qty 50

## 2019-03-18 MED ORDER — IOHEXOL 300 MG/ML  SOLN
100.0000 mL | Freq: Once | INTRAMUSCULAR | Status: AC | PRN
  Administered 2019-03-18: 100 mL via INTRAVENOUS

## 2019-03-20 ENCOUNTER — Other Ambulatory Visit: Payer: Self-pay

## 2019-03-20 ENCOUNTER — Ambulatory Visit
Admission: RE | Admit: 2019-03-20 | Discharge: 2019-03-20 | Disposition: A | Discharge status: Home / Self Care | Payer: Medicare Other | Source: Ambulatory Visit | Attending: Interventional Radiology | Admitting: Interventional Radiology

## 2019-03-20 ENCOUNTER — Encounter: Payer: Self-pay | Admitting: *Deleted

## 2019-03-20 DIAGNOSIS — C641 Malignant neoplasm of right kidney, except renal pelvis: Secondary | ICD-10-CM

## 2019-03-20 HISTORY — PX: IR RADIOLOGIST EVAL & MGMT: IMG5224

## 2019-03-20 NOTE — Progress Notes (Signed)
Patient ID: Dylan Harris, male   DOB: [birth year], 85 y.o.   MRN: [id number]       Chief Complaint:  6 years status post right renal cell carcinoma cryoablation  Referring Physician(s): Stoneking  History of Present Illness: Dylan Harris is a 85 y.o. male who is now approximately 6 years status post cryoablation of a 1.9 cm right renal cell carcinoma.  He returns for telehealth visit for annual outpatient follow-up imaging.  He continues to do very well for his age.  He remains asymptomatic.  No flank pain, abdominal pain, dysuria or hematuria.  No other urinary tract symptoms.  No recent illness or fever.  Stable weight and appetite.  He is closely followed by Dr. Felipa Eth at [facility] urology.  He had a surveillance annual CT 2 days ago.  Past Medical History:  Diagnosis Date   Arthritis    Cancer ([place]) 1990   prostate   Chronic kidney disease    many times   Complication of anesthesia    general anesthesia- severe vomiting, woke up during surgery vomiting and for 13 hours after   Coronary artery disease    2 stents   Depression    mild, on Zoloft   GERD (gastroesophageal reflux disease)    H/O hiatal hernia    History of kidney stones    Hypercholesteremia    Hypertension    PONV (postoperative nausea and vomiting)     Past Surgical History:  Procedure Laterality Date   CARDIAC CATHETERIZATION     CORONARY ANGIOPLASTY  2007   2 stents placed   esophagus stretch  2014   EYE SURGERY Bilateral    cataract   IR GENERIC HISTORICAL  03/10/2016   IR RADIOLOGIST EVAL & MGMT 03/10/2016 Greggory Keen, MD [facility] INTERV RAD   IR RADIOLOGIST EVAL & MGMT  03/21/2017   IR RADIOLOGIST EVAL & MGMT  03/07/2018   KNEE ARTHROPLASTY  2009   medial half of right knee replaced   LITHOTRIPSY     PROSTATECTOMY  1990   TONSILLECTOMY      Allergies: Patient has no known allergies.  Medications: Prior to Admission medications   Medication Sig Start Date End  Date Taking? Authorizing Provider  amLODipine (NORVASC) 10 MG tablet Take 10 mg by mouth every morning.     [provider]  ASPIRIN 81 PO Take 81 mg by mouth daily.    [provider]  hydrochlorothiazide (HYDRODIURIL) 25 MG tablet Take 25 mg by mouth every morning.     [provider]  losartan (COZAAR) 100 MG tablet Take 100 mg by mouth every morning.     [provider]  Multiple Vitamin (MULTIVITAMIN) tablet Take 1 tablet by mouth daily.    [provider]  Multiple Vitamins-Minerals (ICAPS) CAPS Take 1 capsule by mouth 2 (two) times daily.    [provider]  potassium chloride (K-DUR) 10 MEQ tablet Take 10 mEq by mouth 2 (two) times daily.    [provider]  sertraline (ZOLOFT) 100 MG tablet Take 100 mg by mouth every morning.     [provider]  simvastatin (ZOCOR) 40 MG tablet Take 40 mg by mouth every evening.    [provider]     No family history on file.  Social History   Socioeconomic History   Marital status: Married    Spouse name: Not on file   Number of children: Not on file   Years of education:  Not on file   Highest education level: Not on file  Occupational History   Not on file  Social Needs   Financial resource strain: Not on file   Food insecurity    Worry: Not on file    Inability: Not on file   Transportation needs    Medical: Not on file    Non-medical: Not on file  Tobacco Use   Smoking status: Never Smoker   Smokeless tobacco: Never Used  Substance and Sexual Activity   Alcohol use: No   Drug use: No   Sexual activity: Not on file  Lifestyle   Physical activity    Days per week: Not on file    Minutes per session: Not on file   Stress: Not on file  Relationships   Social connections    Talks on phone: Not on file    Gets together: Not on file    Attends religious service: Not on file    Active member of club or organization: Not on file     Attends meetings of clubs or organizations: Not on file    Relationship status: Not on file  Other Topics Concern   Not on file  Social History Narrative   Not on file     Review of Systems  Review of Systems: A 12 point ROS discussed and pertinent positives are indicated in the HPI above.  All other systems are negative.  Physical Exam No direct physical exam was performed, telehealth visit only today Vital Signs: There were no vitals taken for this visit.  Imaging: Ct Abdomen W Wo Contrast  Result Date: 03/18/2019 CLINICAL DATA:  Renal cell carcinoma in 2014 with cryoablation. Prostate cancer in 1990 with prostatectomy. No current complaints. EXAM: CT ABDOMEN WITHOUT AND WITH CONTRAST TECHNIQUE: Multidetector CT imaging of the abdomen was performed following the standard protocol before and following the bolus administration of intravenous contrast. CONTRAST:  121mL OMNIPAQUE IOHEXOL 300 MG/ML  SOLN COMPARISON:  03/07/2018 FINDINGS: Lower chest: Clear lung bases. Normal heart size with right coronary artery atherosclerosis. Small left pleural effusion, new. Tiny hiatal hernia. Hepatobiliary: Subcentimeter right hepatic lobe cyst. A subcapsular more inferior right hepatic lobe lesion is similar in size to on the prior, but too small to entirely characterize. Small gallstones without acute cholecystitis or biliary duct dilatation. Normal gallbladder, without biliary ductal dilatation. Pancreas: Normal, without mass or ductal dilatation. Spleen: Normal in size, without focal abnormality. Adrenals/Urinary Tract: Mild adrenal nodularity is unchanged. Multiple bilateral renal collecting system calculi, without hydronephrosis or abdominal ureteric stone. The largest stone is in the upper pole right kidney at 8 mm. Right kidney- The ablation defect within the posterolateral interpolar right kidney is again identified, without locally recurrent disease. Anterior upper pole right renal 1.6 cm lesion  on 32/16 demonstrates apparent post-contrast enhancement on 39/6. Similar in size on the prior. Lateral interpolar 1.6 cm enhancing right renal lesion on 39/16 is enlarged from 1.3 cm on the prior Central inter/lower pole right renal lesion anteriorly demonstrates postcontrast enhancement and measures 1.9 cm on 52/16. This is similar to on the prior exam (when remeasured). More posterior lower pole right renal lesion measures 1.3 cm on 52/16 and is similar in size to on the prior. Left Kidney- An exophytic upper pole left renal lesion measures 1.3 cm, is hyperattenuating prior to contrast, but demonstrates enhancement, including on 40/6. This is similar in size to on the prior. Posterior upper pole left renal lesion measures 1.5 cm and  demonstrates enhancement, including on 39/6 and 32/16. This is similar in size to on the prior. Left greater than right renal sinus cysts. Lower pole left renal lesion is too small to characterize less than 1 cm but likely a cyst. Stomach/Bowel: The remainder of the stomach is underdistended. Scattered colonic diverticula. Normal small bowel. Vascular/Lymphatic: Advanced aortic and branch vessel atherosclerosis. Patent renal veins bilaterally. No abdominal adenopathy. Other: No ascites. Musculoskeletal: No acute osseous abnormality. S shaped lumbar spine curvature. Lumbosacral spondylosis. IMPRESSION: 1. Status post right sided renal ablation, without locally recurrent or metastatic disease. 2. Bilateral enhancing renal lesions, primarily similar and consistent with neoplasms. A right lateral interpolar 1.6 cm lesion has enlarged from 1.3 cm on the prior. 3. New left pleural effusion. 4. Coronary artery atherosclerosis. Aortic Atherosclerosis (ICD10-I70.0). 5. Cholelithiasis. 6. Bilateral nephrolithiasis. Electronically Signed   By: Abigail Miyamoto M.D.   On: 03/18/2019 15:36    Labs:  CBC: No results for input(s): WBC, HGB, HCT, PLT in the last 8760 hours.  COAGS: No results  for input(s): INR, APTT in the last 8760 hours.  BMP: Recent Labs    03/18/19 1344  CREATININE 1.40*    LIVER FUNCTION TESTS: No results for input(s): BILITOT, AST, ALT, ALKPHOS, PROT, ALBUMIN in the last 8760 hours.  TUMOR MARKERS: No results for input(s): AFPTM, CEA, CA199, CHROMGRNA in the last 8760 hours.  Assessment and Plan:  Approximately 6 years status post cryoablation of a 1.9 cm right renal cell carcinoma.  Stable CT exam demonstrating no change in the previous right renal ablation defect.  No evidence of residual or recurrent tumor at the ablation site.  Surveillance imaging today also demonstrate stability of additional bilateral enhancing renal lesions without significant interval growth or change.   Plan: Continue annual surveillance at 12 months.  If any of the bilateral renal masses demonstrate significant interval growth consider repeat cryoablation.   Electronically Signed: Greggory Keen 03/20/2019, 2:12 PM   I spent a total of    25 Minutes in remote  clinical consultation, greater than 50% of which was counseling/coordinating care for this patient status post prior ablation.    Visit type: Audio only (telephone). Audio (no video) only due to patient's lack of internet/smartphone capability. Alternative for in-person consultation at [facility], [address], [place], [place]. This visit type was conducted due to national recommendations for restrictions regarding the COVID-19 Pandemic (e.g. social distancing).  This format is felt to be most appropriate for this patient at this time.  All issues noted in this document were discussed and addressed.

## 2020-02-13 ENCOUNTER — Other Ambulatory Visit: Payer: Self-pay | Admitting: Interventional Radiology

## 2020-02-13 DIAGNOSIS — N2889 Other specified disorders of kidney and ureter: Secondary | ICD-10-CM

## 2020-02-24 ENCOUNTER — Other Ambulatory Visit: Payer: Self-pay

## 2020-02-24 ENCOUNTER — Encounter (HOSPITAL_COMMUNITY): Payer: Self-pay

## 2020-02-24 ENCOUNTER — Ambulatory Visit (HOSPITAL_COMMUNITY)
Admission: RE | Admit: 2020-02-24 | Discharge: 2020-02-24 | Disposition: A | Discharge status: Home / Self Care | Payer: Medicare Other | Source: Ambulatory Visit | Attending: Interventional Radiology | Admitting: Interventional Radiology

## 2020-02-24 DIAGNOSIS — N2889 Other specified disorders of kidney and ureter: Secondary | ICD-10-CM

## 2020-02-24 LAB — POCT I-STAT CREATININE: Creatinine, Ser: 1.7 mg/dL — ABNORMAL HIGH (ref 0.61–1.24)

## 2020-02-24 MED ORDER — IOHEXOL 350 MG/ML SOLN
100.0000 mL | Freq: Once | INTRAVENOUS | Status: AC | PRN
  Administered 2020-02-24: 80 mL via INTRAVENOUS

## 2020-02-26 ENCOUNTER — Encounter: Payer: Self-pay | Admitting: *Deleted

## 2020-02-26 ENCOUNTER — Ambulatory Visit
Admission: RE | Admit: 2020-02-26 | Discharge: 2020-02-26 | Disposition: A | Discharge status: Home / Self Care | Payer: Medicare Other | Source: Ambulatory Visit | Attending: Interventional Radiology | Admitting: Interventional Radiology

## 2020-02-26 ENCOUNTER — Other Ambulatory Visit: Payer: Self-pay

## 2020-02-26 DIAGNOSIS — N2889 Other specified disorders of kidney and ureter: Secondary | ICD-10-CM

## 2020-02-26 HISTORY — PX: IR RADIOLOGIST EVAL & MGMT: IMG5224

## 2020-02-26 NOTE — Progress Notes (Signed)
Patient ID: Dylan Harris, male   DOB: March 08, 1934, 86 y.o.   MRN: [id number]       Chief Complaint:  7 years status post right renal cell carcinoma ablation  Referring Physician(s): Stoneking  History of Present Illness: Dylan Harris is a 86 y.o. male who is now approximately 7 years status post cryoablation of a 1.0 cm peripheral right renal cell carcinoma.  He returns for annual telehealth visit with follow-up imaging.  Overall he is continuing to do very well for his age.  He remains asymptomatic.  No current flank pain, abdominal pain, dysuria, hematuria or other urinary tract symptoms.  No recent illness or fevers.  Stable weight and appetite.  He has had interval bypass surgery and pacer insertion.  He also now requires hearing aids.  Past Medical History:  Diagnosis Date  . Arthritis   . Cancer [facility]) 1990   prostate  . Chronic kidney disease    many times  . Complication of anesthesia    general anesthesia- severe vomiting, woke up during surgery vomiting and for 13 hours after  . Coronary artery disease    2 stents  . Depression    mild, on Zoloft  . GERD (gastroesophageal reflux disease)   . H/O hiatal hernia   . History of kidney stones   . Hypercholesteremia   . Hypertension   . PONV (postoperative nausea and vomiting)     Past Surgical History:  Procedure Laterality Date  . CARDIAC CATHETERIZATION    . CORONARY ANGIOPLASTY  2007   2 stents placed  . esophagus stretch  2014  . EYE SURGERY Bilateral    cataract  . IR GENERIC HISTORICAL  03/10/2016   IR RADIOLOGIST EVAL & MGMT 03/10/2016 Greggory Keen, MD [facility] INTERV RAD  . IR RADIOLOGIST EVAL & MGMT  03/21/2017  . IR RADIOLOGIST EVAL & MGMT  03/07/2018  . IR RADIOLOGIST EVAL & MGMT  03/20/2019  . KNEE ARTHROPLASTY  2009   medial half of right knee replaced  . LITHOTRIPSY    . PROSTATECTOMY  1990  . TONSILLECTOMY      Allergies: Patient has no known allergies.  Medications: Prior to Admission  medications   Medication Sig Start Date End Date Taking? Authorizing Provider  amLODipine (NORVASC) 10 MG tablet Take 10 mg by mouth every morning.     [provider]  ASPIRIN 81 PO Take 81 mg by mouth daily.    [provider]  hydrochlorothiazide (HYDRODIURIL) 25 MG tablet Take 25 mg by mouth every morning.     [provider]  losartan (COZAAR) 100 MG tablet Take 100 mg by mouth every morning.     [provider]  Multiple Vitamin (MULTIVITAMIN) tablet Take 1 tablet by mouth daily.    [provider]  Multiple Vitamins-Minerals (ICAPS) CAPS Take 1 capsule by mouth 2 (two) times daily.    [provider]  potassium chloride (K-DUR) 10 MEQ tablet Take 10 mEq by mouth 2 (two) times daily.    [provider]  sertraline (ZOLOFT) 100 MG tablet Take 100 mg by mouth every morning.     [provider]  simvastatin (ZOCOR) 40 MG tablet Take 40 mg by mouth every evening.    [provider]     No family history on file.  Social History   Socioeconomic History  . Marital status: Married    Spouse name: Not on file  . Number of children: Not on file  .  Years of education: Not on file  . Highest education level: Not on file  Occupational History  . Not on file  Tobacco Use  . Smoking status: Never Smoker  . Smokeless tobacco: Never Used  Substance and Sexual Activity  . Alcohol use: No  . Drug use: No  . Sexual activity: Not on file  Other Topics Concern  . Not on file  Social History Narrative  . Not on file   Social Determinants of Health   Financial Resource Strain:   . Difficulty of Paying Living Expenses: Not on file  Food Insecurity:   . Worried About Charity fundraiser in the Last Year: Not on file  . Ran Out of Food in the Last Year: Not on file  Transportation Needs:   . Lack of Transportation (Medical): Not on file  . Lack of Transportation (Non-Medical): Not on file  Physical Activity:     . Days of Exercise per Week: Not on file  . Minutes of Exercise per Session: Not on file  Stress:   . Feeling of Stress : Not on file  Social Connections:   . Frequency of Communication with Friends and Family: Not on file  . Frequency of Social Gatherings with Friends and Family: Not on file  . Attends Religious Services: Not on file  . Active Member of Clubs or Organizations: Not on file  . Attends Archivist Meetings: Not on file  . Marital Status: Not on file     Review of Systems  Review of Systems: A 12 point ROS discussed and pertinent positives are indicated in the HPI above.  All other systems are negative.  Physical Exam No direct physical exam was performed, telephone health visit only today Vital Signs: There were no vitals taken for this visit.  Imaging: CT ABDOMEN W WO CONTRAST  Result Date: 02/24/2020 CLINICAL DATA:  Status post cryoablation for right renal cell carcinoma. EXAM: CT ABDOMEN WITHOUT AND WITH CONTRAST TECHNIQUE: Multidetector CT imaging of the abdomen was performed following the standard protocol before and following the bolus administration of intravenous contrast. CONTRAST:  38mL OMNIPAQUE IOHEXOL 350 MG/ML SOLN COMPARISON:  03/18/2019 FINDINGS: Lower chest: Stable small left pleural effusion Hepatobiliary: Small subcapsular hypodensity in the inferior right liver is stable. Additional scattered tiny hypoattenuating foci in the right liver are too small to characterize but appear similar. Gallbladder is nondistended with tiny calcified stones in the neck. No intrahepatic or extrahepatic biliary dilation. Pancreas: No focal mass lesion. No dilatation of the main duct. No intraparenchymal cyst. No peripancreatic edema. Spleen: No splenomegaly. No focal mass lesion. Adrenals/Urinary Tract: No adrenal nodule or mass. Multiple nonobstructing stones are again noted in both kidneys. Right kidney: Ablation defect in the posterolateral interpolar right  kidney is stable. No new or suspicious findings in the ablation bed to raise concern for local recurrence. 1.6 cm lesion in the anterior upper pole the right kidney (11/16) is stable in size in the interval. As before, this lesion appears to show diffuse low level enhancement after IV contrast administration. Lateral interpolar right renal lesion of concern on the previous study is 1.8 cm today (17/16) compared to 1.6 cm previously. This lesion also shows enhancement after IV contrast administration. 1.9 cm enhancing lower interpolar right renal lesion on 29/16 is stable. A second lower pole lesion posteriorly measuring 1.6 cm on 29/16, increased from 1.3 cm previously. Central sinus cysts evident. Left kidney: 1.5 cm exophytic upper pole enhancing lesion in the  left kidney is mildly increased from 1.3 cm previously (coronal 50/17). 1.6 cm enhancing posterior upper pole left renal lesion (11/16) was 1.5 cm previously. 13 mm subcapsular hypoattenuating lesion lower pole left kidney is stable, likely benign cyst. Associated central sinus cyst. Stomach/Bowel: Stomach is unremarkable. No gastric wall thickening. No evidence of outlet obstruction. Duodenum is normally positioned as is the ligament of Treitz. No small bowel or colonic dilatation within the visualized abdomen. Vascular/Lymphatic: There is abdominal aortic atherosclerosis without aneurysm. There is no gastrohepatic or hepatoduodenal ligament lymphadenopathy. No retroperitoneal or mesenteric lymphadenopathy. Other: No intraperitoneal free fluid. Musculoskeletal: No worrisome lytic or sclerotic osseous abnormality. IMPRESSION: 1. Stable appearance post ablation changes in the interpolar right kidney. No findings to raise concern for local recurrence. 2. Multiple bilateral enhancing renal lesions, consistent with neoplasm. Lesion in the lateral interpolar right kidney shows continued progression, now measuring up to 1.8 cm. Remaining lesions appear relatively  stable. 3. Bilateral nephrolithiasis. 4. Stable small left pleural effusion. 5. Cholelithiasis. 6. Aortic Atherosclerosis (ICD10-I70.0). Electronically Signed   By: Misty Stanley M.D.   On: 02/24/2020 11:45    Labs:  CBC: No results for input(s): WBC, HGB, HCT, PLT in the last 8760 hours.  COAGS: No results for input(s): INR, APTT in the last 8760 hours.  BMP: Recent Labs    03/18/19 1344 02/24/20 1004  CREATININE 1.40* 1.70*    LIVER FUNCTION TESTS: No results for input(s): BILITOT, AST, ALT, ALKPHOS, PROT, ALBUMIN in the last 8760 hours.  TUMOR MARKERS: No results for input(s): AFPTM, CEA, CA199, CHROMGRNA in the last 8760 hours.  Assessment and Plan:  Approximately 7 years status post cryoablation of a right renal cell carcinoma.  Stable ablation defect.  No evidence of residual or recurrent disease at the ablation site.  Surveillance CT demonstrates stability of the additional bilateral enhancing renal lesions compatible with neoplasms.  No significant interval growth or change at this time.  Stable nephrolithiasis without acute obstruction.  Plan: Continue annual surveillance.  Repeat CT November 2022.  Electronically Signed: Greggory Keen 02/26/2020, 3:24 PM   I spent a total of    25 Minutes in remote  clinical consultation, greater than 50% of which was counseling/coordinating care for this pt with renal cell carcinoma.    Visit type: Audio only (telephone). Audio (no video) only due to patient's lack of internet/smartphone capability. Alternative for in-person consultation at [facility], [address] [place], [place], [place]. This visit type was conducted due to national recommendations for restrictions regarding the COVID-19 Pandemic (e.g. social distancing).  This format is felt to be most appropriate for this patient at this time.  All issues noted in this document were discussed and addressed.

## 2021-02-24 ENCOUNTER — Other Ambulatory Visit: Payer: Self-pay | Admitting: Interventional Radiology

## 2021-02-24 ENCOUNTER — Other Ambulatory Visit: Payer: Self-pay

## 2021-02-24 DIAGNOSIS — N2889 Other specified disorders of kidney and ureter: Secondary | ICD-10-CM

## 2021-03-15 ENCOUNTER — Encounter (HOSPITAL_COMMUNITY): Payer: Self-pay

## 2021-03-15 ENCOUNTER — Ambulatory Visit (HOSPITAL_COMMUNITY)
Admission: RE | Admit: 2021-03-15 | Discharge: 2021-03-15 | Disposition: A | Discharge status: Home / Self Care | Payer: Medicare Other | Source: Ambulatory Visit | Attending: Interventional Radiology | Admitting: Interventional Radiology

## 2021-03-15 DIAGNOSIS — N2889 Other specified disorders of kidney and ureter: Secondary | ICD-10-CM | POA: Diagnosis present

## 2021-03-15 LAB — POCT I-STAT CREATININE: Creatinine, Ser: 1.5 mg/dL — ABNORMAL HIGH (ref 0.61–1.24)

## 2021-03-15 MED ORDER — IOHEXOL 350 MG/ML SOLN
60.0000 mL | Freq: Once | INTRAVENOUS | Status: AC | PRN
  Administered 2021-03-15: 60 mL via INTRAVENOUS

## 2021-03-15 MED ORDER — SODIUM CHLORIDE (PF) 0.9 % IJ SOLN
INTRAMUSCULAR | Status: AC
  Filled 2021-03-15: qty 50

## 2021-03-23 ENCOUNTER — Ambulatory Visit
Admission: RE | Admit: 2021-03-23 | Discharge: 2021-03-23 | Disposition: A | Discharge status: Home / Self Care | Payer: Medicare Other | Source: Ambulatory Visit | Attending: Interventional Radiology | Admitting: Interventional Radiology

## 2021-03-23 ENCOUNTER — Other Ambulatory Visit: Payer: Self-pay

## 2021-03-23 ENCOUNTER — Encounter: Payer: Self-pay | Admitting: *Deleted

## 2021-03-23 DIAGNOSIS — N2889 Other specified disorders of kidney and ureter: Secondary | ICD-10-CM

## 2021-03-23 HISTORY — PX: IR RADIOLOGIST EVAL & MGMT: IMG5224

## 2021-03-23 NOTE — Progress Notes (Signed)
Patient ID: Dylan Harris, male   DOB: 1933-05-03, 87 y.o.   MRN: [id number]       Chief Complaint:  Right renal cell carcinoma, status post ablation, surveillance of additional bilateral renal masses.  Referring Physician(s): Dr. Felipa Eth  History of Present Illness: Dylan Harris is a 87 y.o. male who is now approximately 8 years status post cryoablation of a 1 cm peripheral right renal cell carcinoma.  He returns for annual telehealth visit with follow-up imaging.  Overall he continues to do very well.  He remains asymptomatic from a kidney standpoint.  No current flank or abdominal pain.  No dysuria or hematuria.  No other urinary tract symptoms, recent illness or fevers.  Stable weight and appetite.  Interval surveillance imaging performed at [facility].  Overall stable right lateral lower pole ablation defect.  There are again stable ill-defined vague enhancing renal masses bilaterally which have been unchanged significantly for nearly 5 years.  Certainly no CT evidence of interval growth of any renal lesion on either kidney.  No other acute finding.  Nonobstructing renal stones again noted.  Past Medical History:  Diagnosis Date   Arthritis    Cancer ([place]) 1990   prostate   Chronic kidney disease    many times   Complication of anesthesia    general anesthesia- severe vomiting, woke up during surgery vomiting and for 13 hours after   Coronary artery disease    2 stents   Depression    mild, on Zoloft   GERD (gastroesophageal reflux disease)    H/O hiatal hernia    History of kidney stones    Hypercholesteremia    Hypertension    PONV (postoperative nausea and vomiting)     Past Surgical History:  Procedure Laterality Date   CARDIAC CATHETERIZATION     CORONARY ANGIOPLASTY  2007   2 stents placed   esophagus stretch  2014   EYE SURGERY Bilateral    cataract   IR GENERIC HISTORICAL  03/10/2016   IR RADIOLOGIST EVAL & MGMT 03/10/2016 Greggory Keen, MD [facility]  INTERV RAD   IR RADIOLOGIST EVAL & MGMT  03/21/2017   IR RADIOLOGIST EVAL & MGMT  03/07/2018   IR RADIOLOGIST EVAL & MGMT  03/20/2019   IR RADIOLOGIST EVAL & MGMT  02/26/2020   KNEE ARTHROPLASTY  2009   medial half of right knee replaced   LITHOTRIPSY     PROSTATECTOMY  1990   TONSILLECTOMY      Allergies: Patient has no known allergies.  Medications: Prior to Admission medications   Medication Sig Start Date End Date Taking? Authorizing Provider  amLODipine (NORVASC) 10 MG tablet Take 10 mg by mouth every morning.     [provider]  ASPIRIN 81 PO Take 81 mg by mouth daily.    [provider]  hydrochlorothiazide (HYDRODIURIL) 25 MG tablet Take 25 mg by mouth every morning.     [provider]  losartan (COZAAR) 100 MG tablet Take 100 mg by mouth every morning.     [provider]  Multiple Vitamin (MULTIVITAMIN) tablet Take 1 tablet by mouth daily.    [provider]  Multiple Vitamins-Minerals (ICAPS) CAPS Take 1 capsule by mouth 2 (two) times daily.    [provider]  potassium chloride (K-DUR) 10 MEQ tablet Take 10 mEq by mouth 2 (two) times daily.    [provider]  sertraline (ZOLOFT) 100 MG tablet Take 100 mg by mouth every morning.  [provider]  simvastatin (ZOCOR) 40 MG tablet Take 40 mg by mouth every evening.    [provider]     No family history on file.  Social History   Socioeconomic History   Marital status: Married    Spouse name: Not on file   Number of children: Not on file   Years of education: Not on file   Highest education level: Not on file  Occupational History   Not on file  Tobacco Use   Smoking status: Never   Smokeless tobacco: Never  Substance and Sexual Activity   Alcohol use: No   Drug use: No   Sexual activity: Not on file  Other Topics Concern   Not on file  Social History Narrative   Not on file   Social Determinants of Health    Financial Resource Strain: Not on file  Food Insecurity: Not on file  Transportation Needs: Not on file  Physical Activity: Not on file  Stress: Not on file  Social Connections: Not on file     Review of Systems  Review of Systems: A 12 point ROS discussed and pertinent positives are indicated in the HPI above.  All other systems are negative.  Physical Exam No direct physical exam was performed, telephone health visit only today Vital Signs: There were no vitals taken for this visit.  Imaging: CT ABDOMEN W WO CONTRAST  Result Date: 03/16/2021 CLINICAL DATA:  Status post cryoablation of right-sided renal cell carcinoma. EXAM: CT ABDOMEN WITHOUT AND WITH CONTRAST TECHNIQUE: Multidetector CT imaging of the abdomen was performed following the standard protocol before and following the bolus administration of intravenous contrast. CONTRAST:  72mL OMNIPAQUE IOHEXOL 350 MG/ML SOLN COMPARISON:  02/24/2020 FINDINGS: Lower chest: Clear lung bases. Normal heart size with incompletely imaged pacer. Trace left pleural thickening. Hepatobiliary: Tiny right hepatic lobe low-density lesions are likely cysts and are not significantly changed. Dependent gallstone. No biliary duct dilatation. Pancreas: Normal, without mass or ductal dilatation. Spleen: Normal in size, without focal abnormality. Adrenals/Urinary Tract: Mild bilateral adrenal nodularity is unchanged. Multiple bilateral renal collecting system calculi. No obstructive uropathy. The ablation defect within the interpolar right kidney measures 1.6 cm on 50/6 and is decreased from 1.9 cm on the prior exam (when remeasured). No locally recurrent disease. Right kidney: 1.5 cm upper pole right renal enhancing lesion on 17/16 is similar to on the prior exam. Lateral upper/interpolar 1.7 cm lesion on 22/16 is similar to 1.8 cm on the prior exam. This also demonstrates postcontrast enhancement. Lower pole right renal lesions of 1.7 and 1.6 cm on 35/16 are  similar to on the prior and also demonstrate mild enhancement. Upper pole right renal 1.2 cm lesion including on 35/11 may mildly enhance and is similar in size to on the prior exam. Left kidney: Exophytic upper pole left renal 1.4 cm lesion is precontrast hyperattenuating but demonstrates postcontrast enhancement including on 18/16. Similar in size to on the prior exam (when remeasured). Enhancing posterior upper pole left renal 2.1 cm lesion including on 19/16 is similar to on the prior exam (when remeasured). Lower pole left renal 1.0 cm cyst is unchanged. Left greater than right renal sinus cysts. Stomach/Bowel: Tiny hiatal hernia. Metallic foreign body within the gastric antrum including on 46/11. Scattered colonic diverticula.  Normal small bowel. Vascular/Lymphatic: Aortic atherosclerosis. Patent renal veins. No retroperitoneal or retrocrural adenopathy. Other: No ascites. Musculoskeletal: Lower lumbar spondylosis with S-shaped spinal curvature. IMPRESSION: 1. Status post right-sided renal ablation, without  recurrent or metastatic disease. 2. Bilateral enhancing solid renal lesions, again suspicious for renal cell carcinoma. These are similar in size. 3. Bilateral nephrolithiasis. 4. Metallic foreign body in the gastric antrum. Correlate with recent endoscopy. 5. Cholelithiasis 6. Trace left pleural fluid. 7.  Tiny hiatal hernia. Electronically Signed   By: Abigail Miyamoto M.D.   On: 03/16/2021 08:34    Labs:  CBC: No results for input(s): WBC, HGB, HCT, PLT in the last 8760 hours.  COAGS: No results for input(s): INR, APTT in the last 8760 hours.  BMP: Recent Labs    03/15/21 0830  CREATININE 1.50*    LIVER FUNCTION TESTS: No results for input(s): BILITOT, AST, ALT, ALKPHOS, PROT, ALBUMIN in the last 8760 hours.  TUMOR MARKERS: No results for input(s): AFPTM, CEA, CA199, CHROMGRNA in the last 8760 hours.  Assessment and Plan:  Approximately 8 years status post cryoablation of a right  renal cell carcinoma.  Stable lateral right kidney ablation defect.  No signs of residual or recurrent disease at the ablation defect.  Surveillance imaging confirms stability of additional bilateral enhancing indeterminate renal lesions compatible with renal neoplasms.  No significant interval growth or change by surveillance CT for several years (approximately 5 years).  Stable nonobstructing nephrolithiasis.  Plan: Continue annual surveillance.  [facility] CT November 2023 at [facility].  Electronically Signed: Greggory Keen 03/23/2021, 3:32 PM   I spent a total of    40 Minutes in remote  clinical consultation, greater than 50% of which was counseling/coordinating care for renal cell carcinoma.    Visit type: Audio only (telephone). Audio (no video) only due to patient's lack of internet/smartphone capability. Alternative for in-person consultation at [facility], [address] [place], [place], [place]. This visit type was conducted due to national recommendations for restrictions regarding the COVID-19 Pandemic (e.g. social distancing).  This format is felt to be most appropriate for this patient at this time.  All issues noted in this document were discussed and addressed.

## 2022-01-31 ENCOUNTER — Other Ambulatory Visit: Payer: Self-pay | Admitting: Interventional Radiology

## 2022-01-31 DIAGNOSIS — N2889 Other specified disorders of kidney and ureter: Secondary | ICD-10-CM

## 2022-09-01 ENCOUNTER — Other Ambulatory Visit: Payer: Self-pay | Admitting: Interventional Radiology

## 2022-09-01 DIAGNOSIS — N2889 Other specified disorders of kidney and ureter: Secondary | ICD-10-CM
